# Patient Record
Sex: Female | Born: 1970 | Race: Black or African American | Hispanic: No | Marital: Single | Smoking: Current some day smoker
Health system: Southern US, Community
[De-identification: ages and names within clinical notes are randomized; demographics above are authoritative.]

## PROBLEM LIST (undated history)

## (undated) DIAGNOSIS — I219 Acute myocardial infarction, unspecified: Secondary | ICD-10-CM

## (undated) DIAGNOSIS — R87629 Unspecified abnormal cytological findings in specimens from vagina: Secondary | ICD-10-CM

## (undated) DIAGNOSIS — E785 Hyperlipidemia, unspecified: Secondary | ICD-10-CM

## (undated) DIAGNOSIS — I2510 Atherosclerotic heart disease of native coronary artery without angina pectoris: Secondary | ICD-10-CM

## (undated) DIAGNOSIS — I10 Essential (primary) hypertension: Secondary | ICD-10-CM

## (undated) HISTORY — DX: Hyperlipidemia, unspecified: E78.5

## (undated) HISTORY — PX: ECTOPIC PREGNANCY SURGERY: SHX613

## (undated) HISTORY — DX: Unspecified abnormal cytological findings in specimens from vagina: R87.629

## (undated) HISTORY — PX: CARDIAC CATHETERIZATION: SHX172

---

## 2010-12-13 ENCOUNTER — Emergency Department (HOSPITAL_BASED_OUTPATIENT_CLINIC_OR_DEPARTMENT_OTHER)
Admission: EM | Admit: 2010-12-13 | Discharge: 2010-12-14 | Disposition: A | Discharge status: Home / Self Care | Payer: PRIVATE HEALTH INSURANCE | Attending: Emergency Medicine | Admitting: Emergency Medicine

## 2010-12-13 ENCOUNTER — Encounter: Payer: Self-pay | Admitting: *Deleted

## 2010-12-13 DIAGNOSIS — R109 Unspecified abdominal pain: Secondary | ICD-10-CM | POA: Insufficient documentation

## 2010-12-13 DIAGNOSIS — I252 Old myocardial infarction: Secondary | ICD-10-CM | POA: Insufficient documentation

## 2010-12-13 DIAGNOSIS — N390 Urinary tract infection, site not specified: Secondary | ICD-10-CM | POA: Insufficient documentation

## 2010-12-13 DIAGNOSIS — A5901 Trichomonal vulvovaginitis: Secondary | ICD-10-CM | POA: Insufficient documentation

## 2010-12-13 DIAGNOSIS — A499 Bacterial infection, unspecified: Secondary | ICD-10-CM | POA: Insufficient documentation

## 2010-12-13 DIAGNOSIS — N760 Acute vaginitis: Secondary | ICD-10-CM | POA: Insufficient documentation

## 2010-12-13 DIAGNOSIS — I2510 Atherosclerotic heart disease of native coronary artery without angina pectoris: Secondary | ICD-10-CM | POA: Insufficient documentation

## 2010-12-13 DIAGNOSIS — F17200 Nicotine dependence, unspecified, uncomplicated: Secondary | ICD-10-CM | POA: Insufficient documentation

## 2010-12-13 DIAGNOSIS — B9689 Other specified bacterial agents as the cause of diseases classified elsewhere: Secondary | ICD-10-CM | POA: Insufficient documentation

## 2010-12-13 HISTORY — DX: Atherosclerotic heart disease of native coronary artery without angina pectoris: I25.10

## 2010-12-13 HISTORY — DX: Acute myocardial infarction, unspecified: I21.9

## 2010-12-13 LAB — URINALYSIS, ROUTINE W REFLEX MICROSCOPIC
Protein, ur: NEGATIVE mg/dL
Specific Gravity, Urine: 1.021 (ref 1.005–1.030)
Urobilinogen, UA: 0.2 mg/dL (ref 0.0–1.0)

## 2010-12-13 LAB — URINE MICROSCOPIC-ADD ON

## 2010-12-13 LAB — PREGNANCY, URINE: Preg Test, Ur: NEGATIVE

## 2010-12-13 NOTE — ED Provider Notes (Signed)
History    Scribed for Suzi Roots, MD, the patient was seen in room MH07/MH07. This chart was scribed by Clarita Crane. This patient's care was started at 11:53PM.  CSN: [id number] Arrival date & time: 12/13/2010 11:05 PM  Chief Complaint  Patient presents with  . Abdominal Pain   HPI Patient is a 40 year old female c/o constant moderate lower abdominal pain onset 2 days ago and persistent since with associated sensation of pressure with urination. Denies dysuria, fever, chills, nausea, vomiting, diarrhea, constipation, vaginal discharge, vaginal bleeding, back pain, flank pain, chest pain, cough, sore throat, SOB, dyspnea. States abdominal pain is not aggravated or relieved by anything. Reports LMP- 3 days ago. Denies h/o UTI, diverticulitis, pyelonephritis, kidney stone. Reports h/o CAD, MI and ectopic pregnancy. Patient is a current smoker, alcohol user and denies drug abuse.   PAST MEDICAL HISTORY:  Past Medical History  Diagnosis Date  . Coronary artery disease   . Myocardial infarction     PAST SURGICAL HISTORY:  Past Surgical History  Procedure Date  . Ectopic pregnancy surgery     MEDICATIONS:  Previous Medications   No medications on file     ALLERGIES:  Allergies as of 12/13/2010 - Review Complete 12/13/2010  Allergen Reaction Noted  . Ivp dye (iodinated diagnostic agents)  12/13/2010     FAMILY HISTORY:  No family history on file.   SOCIAL HISTORY: History   Social History  . Marital Status: Single    Spouse Name: N/A    Number of Children: N/A  . Years of Education: N/A   Social History Main Topics  . Smoking status: Current Everyday Smoker -- 0.5 packs/day  . Smokeless tobacco: None  . Alcohol Use: Yes  . Drug Use: No  . Sexually Active:    Other Topics Concern  . None   Social History Narrative  . None     Review of Systems 10 Systems reviewed and are negative for acute change except as noted in the HPI.  Physical Exam  BP 135/83   Pulse 83  Temp(Src) 98.4 F (36.9 C) (Oral)  Resp 20  SpO2 100%  Physical Exam  Nursing note and vitals reviewed. Constitutional: She is oriented to person, place, and time. Vital signs are normal. She appears well-developed and well-nourished. No distress.  HENT:  Head: Normocephalic and atraumatic.  Eyes: Conjunctivae are normal. Pupils are equal, round, and reactive to light.  Neck: Neck supple.  Cardiovascular: Normal rate and regular rhythm.  Exam reveals no gallop and no friction rub.   No murmur heard. Pulmonary/Chest: Effort normal and breath sounds normal. She has no wheezes. She has no rales.  Abdominal: Soft. Bowel sounds are normal. She exhibits no distension. There is no tenderness. There is no rebound, no guarding and no CVA tenderness.  Genitourinary:       Female chaperone present during exam. No CMT, No adnexal tenderness. Normal external genitalia. Mild discharge noted.   Musculoskeletal: Normal range of motion. She exhibits no edema.       Entire spine non-tender.   Neurological: She is alert and oriented to person, place, and time. No sensory deficit.  Skin: Skin is warm and dry.  Psychiatric: She has a normal mood and affect. Her behavior is normal.    ED Course  Procedures  MDM 12:28AM- Pelvic Exam performed  Results for orders placed during the hospital encounter of 12/13/10  URINALYSIS, ROUTINE W REFLEX MICROSCOPIC      Component Value  Range   Color, Urine YELLOW  YELLOW    Appearance CLEAR  CLEAR    Specific Gravity, Urine 1.021  1.005 - 1.030    pH 7.0  5.0 - 8.0    Glucose, UA NEGATIVE  NEGATIVE (mg/dL)   Hgb urine dipstick NEGATIVE  NEGATIVE    Bilirubin Urine NEGATIVE  NEGATIVE    Ketones, ur NEGATIVE  NEGATIVE (mg/dL)   Protein, ur NEGATIVE  NEGATIVE (mg/dL)   Urobilinogen, UA 0.2  0.0 - 1.0 (mg/dL)   Nitrite NEGATIVE  NEGATIVE    Leukocytes, UA MODERATE (*) NEGATIVE   PREGNANCY, URINE      Component Value Range   Preg Test, Ur NEGATIVE     URINE MICROSCOPIC-ADD ON      Component Value Range   Squamous Epithelial / LPF FEW (*) RARE    WBC, UA 7-10  <3 (WBC/hpf)   RBC / HPF 0-2  <3 (RBC/hpf)   Bacteria, UA FEW (*) RARE    Urine-Other TRICHOMONAS PRESENT    WET PREP, GENITAL      Component Value Range   Yeast, Wet Prep NONE SEEN  NONE SEEN    Trich, Wet Prep TOO NUMEROUS TO COUNT (*) NONE SEEN    Clue Cells, Wet Prep TOO NUMEROUS TO COUNT (*) NONE SEEN    WBC, Wet Prep HPF POC MANY (*) NONE SEEN     Will rx flagyl and cipro. No cerv motion tenderness, or focal adn mass or tenderness. abd soft nt. u cx sent. Pt afeb. No nv.   I personally performed the services described in this documentation, which was scribed in my presence. The recorded information has been reviewed and considered. Suzi Roots, MD    Suzi Roots, MD 12/14/10 0130

## 2010-12-13 NOTE — ED Notes (Signed)
Left lower quad pain x 2 days. States she feels like she has a knot in the area.

## 2010-12-14 LAB — WET PREP, GENITAL

## 2010-12-14 MED ORDER — METRONIDAZOLE 500 MG PO TABS: 500.0000 mg | ORAL_TABLET | Freq: Two times a day (BID) | ORAL | Status: AC

## 2010-12-14 MED ORDER — CIPROFLOXACIN HCL 500 MG PO TABS
500.0000 mg | ORAL_TABLET | Freq: Once | ORAL | Status: AC
  Administered 2010-12-14: 500 mg via ORAL
  Filled 2010-12-14: qty 1

## 2010-12-14 MED ORDER — METRONIDAZOLE 500 MG PO TABS
500.0000 mg | ORAL_TABLET | Freq: Once | ORAL | Status: AC
  Administered 2010-12-14: 500 mg via ORAL
  Filled 2010-12-14: qty 1

## 2010-12-14 MED ORDER — CIPROFLOXACIN HCL 500 MG PO TABS: 500.0000 mg | ORAL_TABLET | Freq: Two times a day (BID) | ORAL | Status: AC

## 2010-12-15 LAB — GC/CHLAMYDIA PROBE AMP, GENITAL
Chlamydia, DNA Probe: NEGATIVE
GC Probe Amp, Genital: NEGATIVE

## 2010-12-15 LAB — URINE CULTURE

## 2012-10-13 DIAGNOSIS — I2510 Atherosclerotic heart disease of native coronary artery without angina pectoris: Secondary | ICD-10-CM | POA: Insufficient documentation

## 2012-10-13 DIAGNOSIS — I10 Essential (primary) hypertension: Secondary | ICD-10-CM | POA: Insufficient documentation

## 2012-10-13 DIAGNOSIS — E785 Hyperlipidemia, unspecified: Secondary | ICD-10-CM | POA: Insufficient documentation

## 2012-10-13 DIAGNOSIS — F32A Depression, unspecified: Secondary | ICD-10-CM | POA: Insufficient documentation

## 2012-10-13 DIAGNOSIS — E559 Vitamin D deficiency, unspecified: Secondary | ICD-10-CM | POA: Insufficient documentation

## 2012-10-13 DIAGNOSIS — F17200 Nicotine dependence, unspecified, uncomplicated: Secondary | ICD-10-CM | POA: Insufficient documentation

## 2012-10-13 DIAGNOSIS — I25119 Atherosclerotic heart disease of native coronary artery with unspecified angina pectoris: Secondary | ICD-10-CM | POA: Insufficient documentation

## 2012-10-13 DIAGNOSIS — D509 Iron deficiency anemia, unspecified: Secondary | ICD-10-CM | POA: Insufficient documentation

## 2014-11-04 ENCOUNTER — Emergency Department (HOSPITAL_BASED_OUTPATIENT_CLINIC_OR_DEPARTMENT_OTHER)
Admission: EM | Admit: 2014-11-04 | Discharge: 2014-11-04 | Disposition: A | Discharge status: Home / Self Care | Payer: PRIVATE HEALTH INSURANCE | Attending: Emergency Medicine | Admitting: Emergency Medicine

## 2014-11-04 ENCOUNTER — Emergency Department (HOSPITAL_BASED_OUTPATIENT_CLINIC_OR_DEPARTMENT_OTHER): Payer: PRIVATE HEALTH INSURANCE

## 2014-11-04 ENCOUNTER — Encounter (HOSPITAL_BASED_OUTPATIENT_CLINIC_OR_DEPARTMENT_OTHER): Payer: Self-pay

## 2014-11-04 DIAGNOSIS — S92912A Unspecified fracture of left toe(s), initial encounter for closed fracture: Secondary | ICD-10-CM

## 2014-11-04 DIAGNOSIS — Y998 Other external cause status: Secondary | ICD-10-CM | POA: Insufficient documentation

## 2014-11-04 DIAGNOSIS — Z79899 Other long term (current) drug therapy: Secondary | ICD-10-CM | POA: Insufficient documentation

## 2014-11-04 DIAGNOSIS — W231XXA Caught, crushed, jammed, or pinched between stationary objects, initial encounter: Secondary | ICD-10-CM | POA: Insufficient documentation

## 2014-11-04 DIAGNOSIS — Y9301 Activity, walking, marching and hiking: Secondary | ICD-10-CM | POA: Insufficient documentation

## 2014-11-04 DIAGNOSIS — I2510 Atherosclerotic heart disease of native coronary artery without angina pectoris: Secondary | ICD-10-CM | POA: Insufficient documentation

## 2014-11-04 DIAGNOSIS — Z720 Tobacco use: Secondary | ICD-10-CM | POA: Insufficient documentation

## 2014-11-04 DIAGNOSIS — I10 Essential (primary) hypertension: Secondary | ICD-10-CM | POA: Insufficient documentation

## 2014-11-04 DIAGNOSIS — I252 Old myocardial infarction: Secondary | ICD-10-CM | POA: Insufficient documentation

## 2014-11-04 DIAGNOSIS — S92525A Nondisplaced fracture of medial phalanx of left lesser toe(s), initial encounter for closed fracture: Secondary | ICD-10-CM | POA: Insufficient documentation

## 2014-11-04 DIAGNOSIS — Y9289 Other specified places as the place of occurrence of the external cause: Secondary | ICD-10-CM | POA: Insufficient documentation

## 2014-11-04 HISTORY — DX: Essential (primary) hypertension: I10

## 2014-11-04 MED ORDER — OXYCODONE-ACETAMINOPHEN 5-325 MG PO TABS: 1.0000 | ORAL_TABLET | Freq: Four times a day (QID) | ORAL | Status: DC | PRN

## 2014-11-04 MED ORDER — OXYCODONE-ACETAMINOPHEN 5-325 MG PO TABS: 1.0000 | ORAL_TABLET | Freq: Once | ORAL | Status: DC

## 2014-11-04 NOTE — Discharge Instructions (Signed)
Return to the ED with any concerns including increased pain, swelling, discoloration or numbness of toes, or any other alarming symptoms

## 2014-11-04 NOTE — ED Notes (Signed)
Left fourth toe injury 3 days ago

## 2014-11-04 NOTE — ED Notes (Signed)
Buddy tape to left fourth toe and post op shoe applied.  CMS intake post intervention.

## 2014-11-04 NOTE — ED Provider Notes (Signed)
CSN: [id number]     Arrival date & time 11/04/14  1119 History   First MD Initiated Contact with Patient 11/04/14 1142     Chief Complaint  Patient presents with  . Toe Injury     (Consider location/radiation/quality/duration/timing/severity/associated sxs/prior Treatment) HPI  Pt presenting with pain in left 4th toe after injuring the toe 3 days ago.  She was walking and caught her toe on something.  The pain has not improved which prompted ED visit today.  Pain is constant and throbbing.  Pain is worse with movement and palpation.  No other areas of pain or injury.  There are no other associated systemic symptoms, there are no other alleviating or modifying factors.   Past Medical History  Diagnosis Date  . Coronary artery disease   . Myocardial infarction   . Hypertension    Past Surgical History  Procedure Laterality Date  . Ectopic pregnancy surgery     No family history on file. History  Substance Use Topics  . Smoking status: Current Every Day Smoker -- 0.50 packs/day  . Smokeless tobacco: Not on file  . Alcohol Use: Yes   OB History    No data available     Review of Systems  ROS reviewed and all otherwise negative except for mentioned in HPI    Allergies  Ivp dye  Home Medications   Prior to Admission medications   Medication Sig Start Date End Date Taking? Authorizing Provider  METOPROLOL TARTRATE PO Take by mouth.   Yes Historical Provider, MD  oxyCODONE-acetaminophen (PERCOCET/ROXICET) 5-325 MG per tablet Take 1-2 tablets by mouth every 6 (six) hours as needed for severe pain. 11/04/14   Jerelyn ScottMartha Linker, MD   BP 145/105 mmHg  Pulse 79  Temp(Src) 98 F (36.7 C) (Oral)  Resp 18  Ht 5\' 4"  (1.626 m)  Wt 202 lb (91.627 kg)  BMI 34.66 kg/m2  SpO2 100%  LMP 10/29/2014  Vitals reviewed Physical Exam  Physical Examination: General appearance - alert, well appearing, and in no distress Mental status - alert, oriented to person, place, and time Eyes - no  conjunctival injection, no scleral icterus Chest - clear to auscultation, no wheezes, rales or rhonchi, symmetric air entry Neurological - alert, oriented x 3, toe is distally NVI Musculoskeletal - ttp over left 4th toe with some soft tissue swelling, otherwise no joint tenderness, deformity or swelling Extremities - peripheral pulses normal, no pedal edema, no clubbing or cyanosis Skin - normal coloration and turgor, no rashes  ED Course  Procedures (including critical care time) Labs Review Labs Reviewed - No data to display  Imaging Review No results found.   EKG Interpretation None      MDM   Final diagnoses:  Toe fracture, left, closed, initial encounter    Xray shoes oblique fracture of 4th toe-  Xray images viewed and interpreted by me as well.  Toe buddy taped and patient given pain medication- given information for ortho followup.  Discharged with strict return precautions.  Pt agreeable with plan.  Nursing notes including past medical history and social history reviewed and considered in documentation      Jerelyn ScottMartha Linker, MD 11/07/14 1924

## 2015-05-22 DIAGNOSIS — I252 Old myocardial infarction: Secondary | ICD-10-CM | POA: Insufficient documentation

## 2017-02-01 IMAGING — CR DG TOE 4TH 2+V*L*
3 series · 3 of 3 positions shown · non-contrast
Comparison: None.

CLINICAL DATA: Stubbed left fourth toe 2 days ago on vacuum, pain

EXAM:
LEFT FOURTH TOE

[t toes ap left]
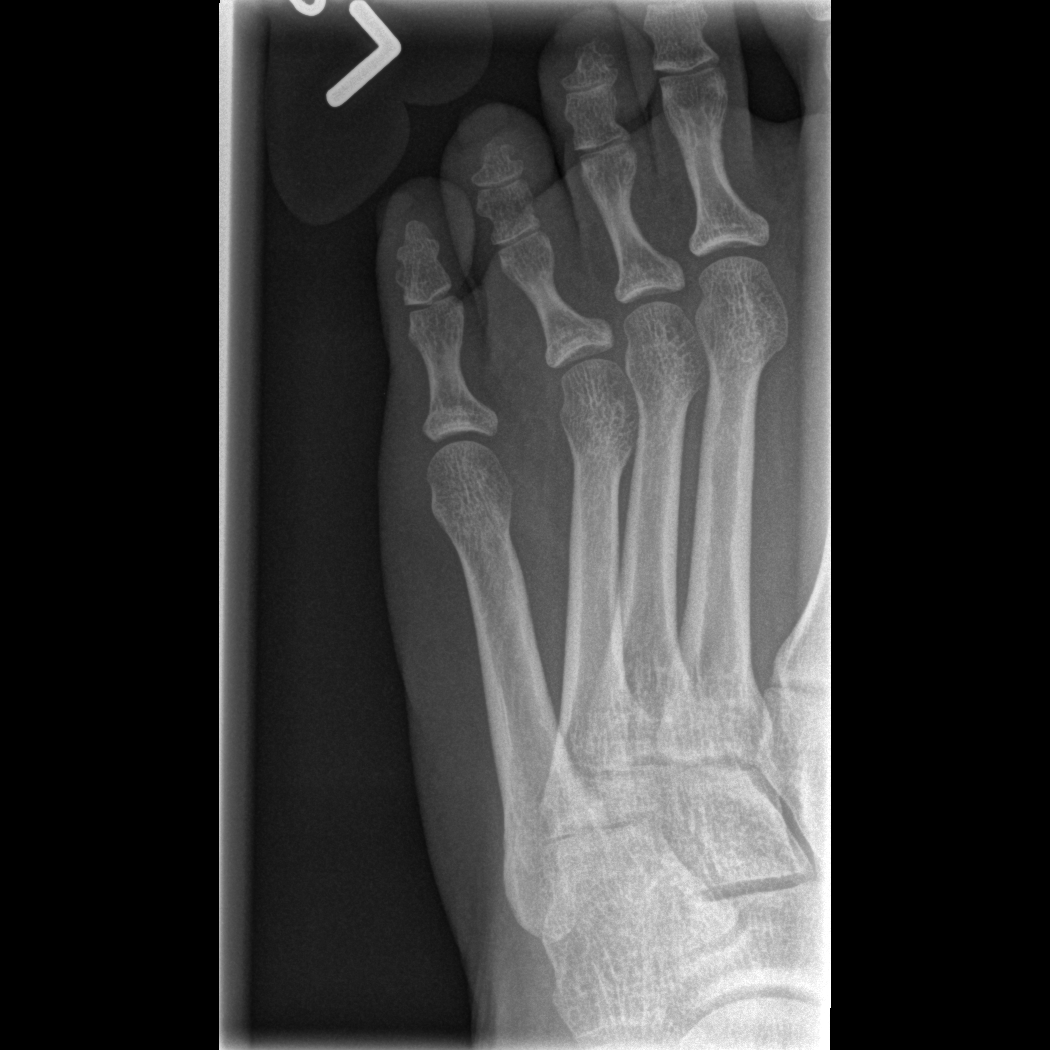

[t toes oblique left]
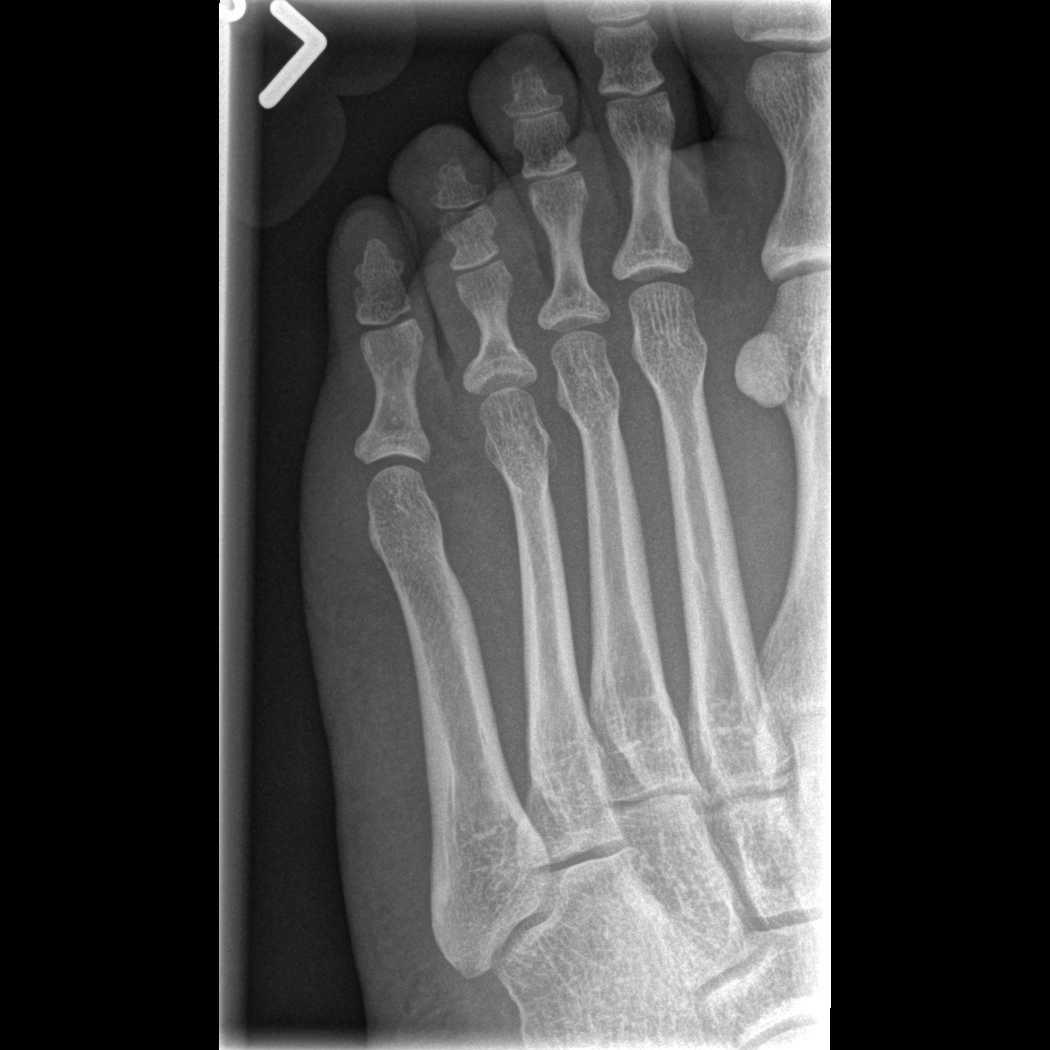

[t toes lateral left]
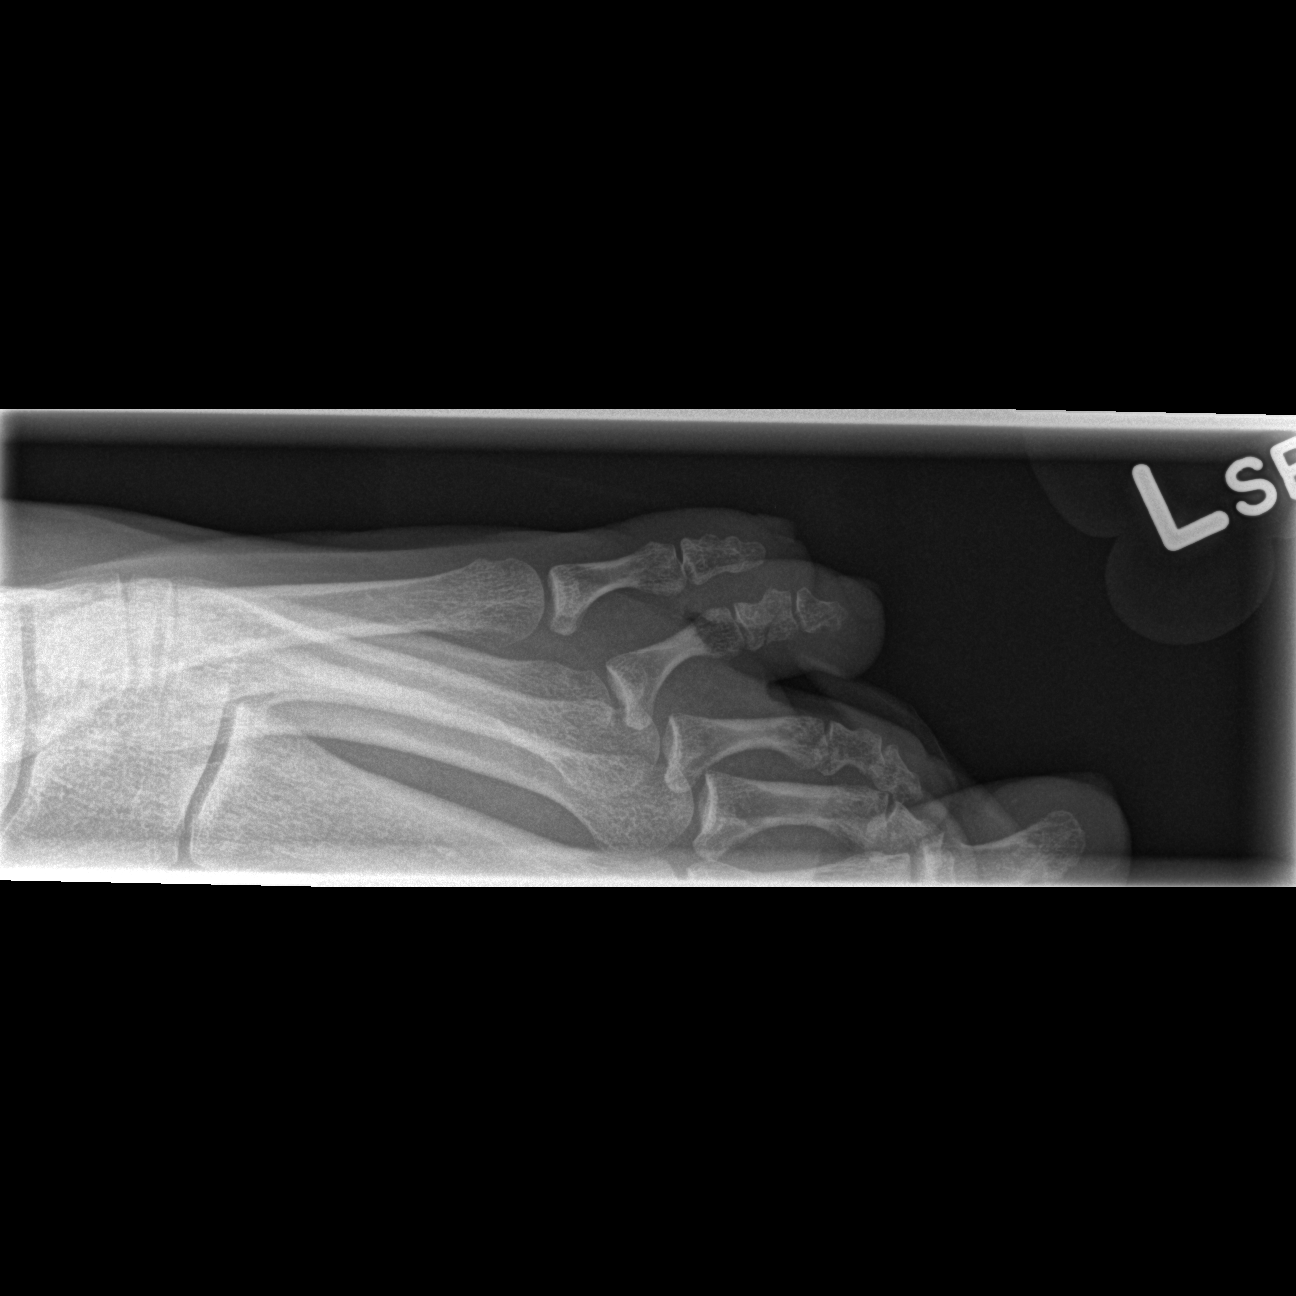

[3 of 3 positions shown; findings below may reference images not displayed]

FINDINGS: Three views of left fourth toe submitted. There is nondisplaced
oblique fracture middle phalanx seen on lateral view.
IMPRESSION: Nondisplaced oblique fracture middle phalanx left fourth toe.

## 2022-07-01 ENCOUNTER — Telehealth (HOSPITAL_BASED_OUTPATIENT_CLINIC_OR_DEPARTMENT_OTHER): Payer: Self-pay

## 2022-07-01 ENCOUNTER — Encounter: Payer: Self-pay | Admitting: Family

## 2022-07-01 ENCOUNTER — Ambulatory Visit (INDEPENDENT_AMBULATORY_CARE_PROVIDER_SITE_OTHER): Payer: 59 | Admitting: Family

## 2022-07-01 VITALS — BP 140/100 | HR 82 | Resp 18 | Ht 64.0 in | Wt 223.0 lb

## 2022-07-01 DIAGNOSIS — Z124 Encounter for screening for malignant neoplasm of cervix: Secondary | ICD-10-CM | POA: Diagnosis not present

## 2022-07-01 DIAGNOSIS — K625 Hemorrhage of anus and rectum: Secondary | ICD-10-CM | POA: Diagnosis not present

## 2022-07-01 DIAGNOSIS — R7309 Other abnormal glucose: Secondary | ICD-10-CM

## 2022-07-01 DIAGNOSIS — I10 Essential (primary) hypertension: Secondary | ICD-10-CM

## 2022-07-01 DIAGNOSIS — Z13220 Encounter for screening for lipoid disorders: Secondary | ICD-10-CM | POA: Diagnosis not present

## 2022-07-01 DIAGNOSIS — Z1231 Encounter for screening mammogram for malignant neoplasm of breast: Secondary | ICD-10-CM

## 2022-07-01 DIAGNOSIS — Z720 Tobacco use: Secondary | ICD-10-CM

## 2022-07-01 DIAGNOSIS — I252 Old myocardial infarction: Secondary | ICD-10-CM

## 2022-07-01 LAB — COMPREHENSIVE METABOLIC PANEL
ALT: 13 U/L (ref 0–35)
AST: 15 U/L (ref 0–37)
Albumin: 4.5 g/dL (ref 3.5–5.2)
Alkaline Phosphatase: 100 U/L (ref 39–117)
BUN: 12 mg/dL (ref 6–23)
CO2: 30 mEq/L (ref 19–32)
Calcium: 9.5 mg/dL (ref 8.4–10.5)
Chloride: 102 mEq/L (ref 96–112)
Creatinine, Ser: 0.84 mg/dL (ref 0.40–1.20)
GFR: 81.73 mL/min (ref >60.00)
Glucose, Bld: 88 mg/dL (ref 70–99)
Potassium: 3.7 mEq/L (ref 3.5–5.1)
Sodium: 140 mEq/L (ref 135–145)
Total Bilirubin: 0.4 mg/dL (ref 0.2–1.2)
Total Protein: 6.9 g/dL (ref 6.0–8.3)

## 2022-07-01 LAB — CBC WITH DIFFERENTIAL/PLATELET
Basophils Absolute: 0 10*3/uL (ref 0.0–0.1)
Basophils Relative: 0.4 % (ref 0.0–3.0)
Eosinophils Absolute: 0.3 10*3/uL (ref 0.0–0.7)
Eosinophils Relative: 3.1 % (ref 0.0–5.0)
HCT: 43.6 % (ref 36.0–46.0)
Hemoglobin: 14.4 g/dL (ref 12.0–15.0)
Lymphocytes Relative: 44.7 % (ref 12.0–46.0)
Lymphs Abs: 4.1 10*3/uL — ABNORMAL HIGH (ref 0.7–4.0)
MCHC: 33.1 g/dL (ref 30.0–36.0)
MCV: 82.8 fl (ref 78.0–100.0)
Monocytes Absolute: 0.7 10*3/uL (ref 0.1–1.0)
Monocytes Relative: 7.2 % (ref 3.0–12.0)
Neutro Abs: 4.1 10*3/uL (ref 1.4–7.7)
Neutrophils Relative %: 44.6 % (ref 43.0–77.0)
Platelets: 373 10*3/uL (ref 150.0–400.0)
RBC: 5.26 Mil/uL — ABNORMAL HIGH (ref 3.87–5.11)
RDW: 15.9 % — ABNORMAL HIGH (ref 11.5–15.5)
WBC: 9.1 10*3/uL (ref 4.0–10.5)

## 2022-07-01 LAB — LIPID PANEL
Cholesterol: 215 mg/dL — ABNORMAL HIGH (ref 0–200)
HDL: 45.8 mg/dL (ref >39.00)
LDL Cholesterol: 144 mg/dL — ABNORMAL HIGH (ref 0–99)
NonHDL: 169.19
Total CHOL/HDL Ratio: 5
Triglycerides: 128 mg/dL (ref 0.0–149.0)
VLDL: 25.6 mg/dL (ref 0.0–40.0)

## 2022-07-01 LAB — HEMOGLOBIN A1C: Hgb A1c MFr Bld: 6 % (ref 4.6–6.5)

## 2022-07-01 MED ORDER — AMLODIPINE BESYLATE 5 MG PO TABS: 5.0000 mg | ORAL_TABLET | Freq: Every day | ORAL | 1 refills | Status: DC

## 2022-07-01 MED ORDER — ROSUVASTATIN CALCIUM 10 MG PO TABS: 10.0000 mg | ORAL_TABLET | Freq: Every day | ORAL | 3 refills | Status: DC

## 2022-07-01 NOTE — Progress Notes (Signed)
Amy Wade is a 52 y.o. female with the following history as recorded in [employer]:  Patient Active Problem List   Diagnosis Date Noted   History of MI (myocardial infarction) 05/22/2015   Coronary atherosclerosis 10/13/2012   Essential hypertension 10/13/2012   Tobacco use disorder 10/13/2012    Current Outpatient Medications  Medication Sig Dispense Refill   rosuvastatin (CRESTOR) 10 MG tablet Take 1 tablet (10 mg total) by mouth daily. 90 tablet 3   amLODipine (NORVASC) 5 MG tablet Take 1 tablet (5 mg total) by mouth daily. 90 tablet 1   No current facility-administered medications for this visit.    Allergies: Ivp dye [iodinated contrast media]  Past Medical History:  Diagnosis Date   Coronary artery disease    Hypertension    Myocardial infarction [facility])     Past Surgical History:  Procedure Laterality Date   ECTOPIC PREGNANCY SURGERY      No family history on file.  Social History   Tobacco Use   Smoking status: Every Day    Packs/day: 0.50    Types: Cigarettes   Smokeless tobacco: Not on file  Substance Use Topics   Alcohol use: Yes    Subjective:   Presents today as a new patient; accompanied by her mother; notes she moved back to [place] almost a year ago and needs to get healthcare set up;  MI in 2011- needs to get back under care of cardiology; had one stent placed;  Has been "stretching" her blood pressure medication for the past year- has been on Amlodipine 5 mg intermittently for the past year; per patient, her last provider "stopped" her cholesterol medication; does not check her blood pressure regularly; has been having increased headaches;   Needs to get set up for cardiology, GYN;   Has been having some rectal bleeding intermittently for the past month- describes blood as "dark"- no abdominal pain; no weight loss; no appetite changes; has not had baseline colonoscopy;   LMP- uterine ablation 2021- LMP over a year ago;      Objective:  Vitals:    07/01/22 0836 07/01/22 0913  BP: (!) 148/92 (!) 140/100  Pulse: 82   Resp: 18   SpO2: 100%   Weight: 223 lb (101.2 kg)   Height: '5\' 4"'$  (1.626 m)     General: Well developed, well nourished, in no acute distress  Skin : Warm and dry.  Head: Normocephalic and atraumatic  Lungs: Respirations unlabored; clear to auscultation bilaterally without wheeze, rales, rhonchi  CVS exam: normal rate and regular rhythm.  Musculoskeletal: No deformities; no active joint inflammation  Extremities: No edema, cyanosis, clubbing  Vessels: Symmetric bilaterally  Neurologic: Alert and oriented; speech intact; face symmetrical; moves all extremities well; CNII-XII intact without focal deficit   Assessment:  1. Rectal bleeding   2. Lipid screening   3. Elevated glucose   4. Cervical cancer screening   5. History of MI (myocardial infarction)   6. Essential hypertension   7. Visit for screening mammogram   8. Tobacco abuse     Plan:  Check CBC, CMP today; refer to GI for further evaluation- will need baseline colonoscopy; Check lipid panel- explained to patient that with history of MI/ CAD, she does need to be on statin- will re-start Crestor 10 mg daily; may need to increase or adjust dosage based on labs; Check Hgba1c; Refer to GYN to establish care Stressed need to quit smoking and take her medications as prescribed; plan to follow up  in 2 months- will most likely need to adjust blood pressure medication if changes are not made sooner by cardiology;  Uncontrolled- has not been taking medication regularly; refill Amlodipine 5 mg; follow up in 2 months;  Order updated;   No follow-ups on file.  Orders Placed This Encounter  Procedures   MM Digital Screening    Standing Status:   Future    Standing Expiration Date:   07/02/2023    Order Specific Question:   Reason for Exam (SYMPTOM  OR DIAGNOSIS REQUIRED)    Answer:   screening mammogram    Order Specific Question:   Is the patient pregnant?     Answer:   No    Order Specific Question:   Preferred imaging location?    Answer:   [facility]   CBC with Differential/Platelet   Comp Met (CMET)   Lipid panel   Hemoglobin A1c   Iron, TIBC and Ferritin Panel   Ambulatory referral to Obstetrics / Gynecology    Referral Priority:   Routine    Referral Type:   Consultation    Referral Reason:   Specialty Services Required    Requested Specialty:   Obstetrics and Gynecology    Number of Visits Requested:   1   Ambulatory referral to Gastroenterology    Referral Priority:   Routine    Referral Type:   Consultation    Referral Reason:   Specialty Services Required    Number of Visits Requested:   1   Ambulatory referral to Cardiology    Referral Priority:   Routine    Referral Type:   Consultation    Referral Reason:   Specialty Services Required    Requested Specialty:   Cardiology    Number of Visits Requested:   1    Requested Prescriptions   Signed Prescriptions Disp Refills   amLODipine (NORVASC) 5 MG tablet 90 tablet 1    Sig: Take 1 tablet (5 mg total) by mouth daily.   rosuvastatin (CRESTOR) 10 MG tablet 90 tablet 3    Sig: Take 1 tablet (10 mg total) by mouth daily.

## 2022-07-02 LAB — IRON,TIBC AND FERRITIN PANEL
%SAT: 12 % (calc) — ABNORMAL LOW (ref 16–45)
Ferritin: 12 ng/mL — ABNORMAL LOW (ref 16–232)
Iron: 48 ug/dL (ref 40–190)
TIBC: 399 mcg/dL (calc) (ref 250–450)

## 2022-07-08 ENCOUNTER — Encounter: Payer: Self-pay | Admitting: General Practice

## 2022-07-24 NOTE — Progress Notes (Signed)
Cardiology Office Note:   Date:  07/26/2022  NAME:  Amy Wade    MRN: [id number] DOB:  10-11-1970   PCP:  Amy Bass, FNP  Cardiologist:  None  Electrophysiologist:  None   Referring MD: Amy Wade,*   Chief Complaint  Patient presents with   Chest Pain   History of Present Illness:   Amy Wade is a 52 y.o. female with a hx of HTN, HLD, CAD who is being seen today for the evaluation of CAD at the request of Amy Scrape Allyne Gee, FNP.  She reports for the past 2 weeks she has had episodes of chest pressure.  Described as left-sided.  Occurs while sitting.  She reports position improved this.  She also reports stretching her back.  She tells me activity does not exacerbate it.  Rest does not improve it.  She reports she has it every day.  Last several minutes.  No association with food.  No fevers or chills.  BP today 182/104.  Not controlled.  She did not take her medications.  She has a history of PCI in 2012 at [facility].  I do not know the details.  Apparently she had a heart cath in 2016 per review of records that was normal.  EKG demonstrates sinus rhythm heart rate 72 with repolarization abnormality.  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  Her CV examination is normal.  She is a current every day smoker.  She has smoked for nearly 20 years.  She started back 2 years ago.  We discussed stopping this.  She has not started her Crestor.  She is not diabetic.  She is not married.  She has children and grandchildren.  No regular exercise.  She reports her diet is poor.  She does have a heart history in the family and her father.  Symptoms are continuing daily.  Suspect this is musculoskeletal.  We discussed stress test for further evaluation.  She is okay to do this.  Problem List CAD -PCI 2012 ([place]) 2. HLD -T chol 215, HDL 45, LDL 144, TG 128 3. HTN  Past Medical History: Past Medical History:  Diagnosis Date    Coronary artery disease    Hyperlipidemia    Hypertension    Myocardial infarction [facility])     Past Surgical History: Past Surgical History:  Procedure Laterality Date   CARDIAC CATHETERIZATION     ECTOPIC PREGNANCY SURGERY      Current Medications: Current Meds  Medication Sig   amLODipine (NORVASC) 5 MG tablet Take 1 tablet (5 mg total) by mouth daily.   aspirin EC 81 MG tablet Take 1 tablet (81 mg total) by mouth daily. Swallow whole.   losartan (COZAAR) 50 MG tablet Take 1 tablet (50 mg total) by mouth daily.   nitroGLYCERIN (NITROSTAT) 0.4 MG SL tablet Place 1 tablet (0.4 mg total) under the tongue every 5 (five) minutes as needed for chest pain.   [DISCONTINUED] rosuvastatin (CRESTOR) 10 MG tablet Take 1 tablet (10 mg total) by mouth daily.     Allergies:    Ivp dye [iodinated contrast media]   Social History: Social History   Socioeconomic History   Marital status: Single    Spouse name: Not on file   Number of children: 2   Years of education: Not on file   Highest education level: Not on file  Occupational History   Not on file  Tobacco Use   Smoking  status: Every Day    Packs/day: 0.50    Years: 20.00    Additional pack years: 0.00    Total pack years: 10.00    Types: Cigarettes   Smokeless tobacco: Not on file  Substance and Sexual Activity   Alcohol use: Yes   Drug use: No   Sexual activity: Yes    Birth control/protection: None  Other Topics Concern   Not on file  Social History Narrative   Not on file   Social Determinants of Health   Financial Resource Strain: Not on file  Food Insecurity: Not on file  Transportation Needs: Not on file  Physical Activity: Not on file  Stress: Not on file  Social Connections: Not on file     Family History: The patient's family history includes Heart disease in her father.  ROS:   All other ROS reviewed and negative. Pertinent positives noted in the HPI.     EKGs/Labs/Other Studies Reviewed:   The  following studies were personally reviewed by me today:  EKG:  EKG is ordered today.  The ekg ordered today demonstrates normal sinus rhythm heart rate 72, inferior Q waves, inferolateral T wave inversions likely repolarization abnormality, and was personally reviewed by me.   Recent Labs: 07/01/2022: ALT 13; BUN 12; Creatinine, Ser 0.84; Hemoglobin 14.4; Platelets 373.0; Potassium 3.7; Sodium 140   Recent Lipid Panel    Component Value Date/Time   CHOL 215 (H) 07/01/2022 0909   TRIG 128.0 07/01/2022 0909   HDL 45.80 07/01/2022 0909   CHOLHDL 5 07/01/2022 0909   VLDL 25.6 07/01/2022 0909   LDLCALC 144 (H) 07/01/2022 0909    Physical Exam:   VS:  BP (!) 182/104   Pulse 72   Ht 5\' 4"  (1.626 m)   Wt 222 lb (100.7 kg)   SpO2 99%   BMI 38.11 kg/m    Wt Readings from Last 3 Encounters:  07/26/22 222 lb (100.7 kg)  07/01/22 223 lb (101.2 kg)  11/04/14 202 lb (91.6 kg)    General: Well nourished, well developed, in no acute distress Head: Atraumatic, normal size  Eyes: PEERLA, EOMI  Neck: Supple, no JVD Endocrine: No thryomegaly Cardiac: Normal S1, S2; RRR; no murmurs, rubs, or gallops Lungs: Clear to auscultation bilaterally, no wheezing, rhonchi or rales  Abd: Soft, nontender, no hepatomegaly  Ext: No edema, pulses 2+ Musculoskeletal: No deformities, BUE and BLE strength normal and equal Skin: Warm and dry, no rashes   Neuro: Alert and oriented to person, place, time, and situation, CNII-XII grossly intact, no focal deficits  Psych: Normal mood and affect   ASSESSMENT:   Amy Wade is a 52 y.o. female who presents for the following: 1. Precordial pain   2. Coronary artery disease involving native coronary artery of native heart without angina pectoris   3. Mixed hyperlipidemia   4. Renovascular hypertension   5. Tobacco abuse     PLAN:   1. Precordial pain 2. Coronary artery disease involving native coronary artery of native heart without angina  pectoris -History of PCI in 2012 in [facility].  Details are unclear.  She reports she is having 2 weeks of pressure in her chest.  Symptoms occur at rest.  Not exertional.  Alleviated by rest.  No association with food.  CV exam normal.  EKG shows inferior infarct which was on her EKG report from 2017 through care everywhere.  She does have inferolateral T wave inversions which appear to be new.  This could be related to hypertension.  She is without chest discomfort currently.  We discussed a PET stress test as well as echocardiogram for further evaluation.  She should start aspirin.  She will be given a prescription for sublingual nitroglycerin.  Her symptoms could be blood pressure related.  I would like to add losartan 50.  She is on amlodipine 5 as well.  She will also start on Crestor.  We also discussed smoking cessation.  She will see me back in 3 months to discuss further.  She was given strict return precautions. -Of note her EKG is abnormal.  She cannot undergo an exercise treadmill stress test due to inability to exercise.  3. Mixed hyperlipidemia -Start Crestor 20 mg daily.  Goal LDL less than 55.  4. Renovascular hypertension -Continue amlodipine 5 mg daily.  Above symptoms could be blood pressure related.  Add losartan 50 mg daily.  Obtain echo.  Stress test as above.  Smoking cessation discussed.  5. Tobacco abuse -3 minutes of smoking cessation counseling was provided in office today.  She is an everyday smoker for the past 20 years.  Needs to quit.  We discussed the relationship of smoking and heart disease.   Shared Decision Making/Informed Consent The risks [chest pain, shortness of breath, cardiac arrhythmias, dizziness, blood pressure fluctuations, myocardial infarction, stroke/transient ischemic attack, nausea, vomiting, allergic reaction, radiation exposure, metallic taste sensation and life-threatening complications (estimated to be 1 in 10,000)], benefits  (risk stratification, diagnosing coronary artery disease, treatment guidance) and alternatives of a cardiac PET stress test were discussed in detail with Ms. Gorsuch and she agrees to proceed.  Disposition: Return in about 3 months (around 10/26/2022).  Medication Adjustments/Labs and Tests Ordered: Current medicines are reviewed at length with the patient today.  Concerns regarding medicines are outlined above.  Orders Placed This Encounter  Procedures   NM PET CT CARDIAC PERFUSION MULTI W/ABSOLUTE BLOODFLOW   Lipid panel   EKG 12-Lead   ECHOCARDIOGRAM COMPLETE   Meds ordered this encounter  Medications   losartan (COZAAR) 50 MG tablet    Sig: Take 1 tablet (50 mg total) by mouth daily.    Dispense:  90 tablet    Refill:  3   rosuvastatin (CRESTOR) 20 MG tablet    Sig: Take 1 tablet (20 mg total) by mouth daily.    Dispense:  90 tablet    Refill:  3   aspirin EC 81 MG tablet    Sig: Take 1 tablet (81 mg total) by mouth daily. Swallow whole.    Dispense:  90 tablet    Refill:  3   nitroGLYCERIN (NITROSTAT) 0.4 MG SL tablet    Sig: Place 1 tablet (0.4 mg total) under the tongue every 5 (five) minutes as needed for chest pain.    Dispense:  25 tablet    Refill:  3    Patient Instructions  Medication Instructions:  START Losartan 50 mg daily  INCREASE Crestor 20 mg daily  START Aspirin 81 mg daily  TAKE Nitroglycerin as directed.  NITROGLYCERIN  is a type of vasodilator. It relaxes blood vessels, increasing the blood and oxygen supply to your heart. This medicine is used to relieve chest pain caused by angina.  In an angina attack (CHEST PAIN), you should feel better within 5 minutes after your first dose. Do not swallow whole. Place tablet under your tongue. Sit down when taking this medicine. You can take a dose every 5 minutes up to a  total of 3 doses. If you do not feel better or feel worse after 1 dose, call 9-1-1 at once. Do not take more than 3 doses in 15 minutes. Do  not take your medicine more often than directed.   *If you need a refill on your cardiac medications before your next appointment, please call your pharmacy*   Lab Work: LIPID (come back fasting, 1 week before 3 month visit)   If you have labs (blood work) drawn today and your tests are completely normal, you will receive your results only by: MyChart Message (if you have MyChart) OR A paper copy in the mail If you have any lab test that is abnormal or we need to change your treatment, we will call you to review the results.   Testing/Procedures: Echocardiogram - Your physician has requested that you have an echocardiogram. Echocardiography is a painless test that uses sound waves to create images of your heart. It provides your doctor with information about the size and shape of your heart and how well your heart's chambers and valves are working. This procedure takes approximately one hour. There are no restrictions for this procedure.   CARDIAC PET- Your physician has requested that you have a Cardiac Pet Stress Test. This testing is completed at [facility] ([address] [place], [place] [address]). The schedulers will call you to get this scheduled. Please follow instructions below and call the office with any questions/concerns [phone]).   Follow-Up: At [facility], you and your health needs are our priority.  As part of our continuing mission to provide you with exceptional heart care, we have created designated Provider Care Teams.  These Care Teams include your primary Cardiologist (physician) and Advanced Practice Providers (APPs -  Physician Assistants and Nurse Practitioners) who all work together to provide you with the care you need, when you need it.  We recommend signing up for the patient portal called "MyChart".  Sign up information is provided on this After Visit Summary.  MyChart is used to connect with patients for Virtual Visits  (Telemedicine).  Patients are able to view lab/test results, encounter notes, upcoming appointments, etc.  Non-urgent messages can be sent to your provider as well.   To learn more about what you can do with MyChart, go to [email or web address].    Your next appointment:   3 month(s)  Provider:   Azalee Course, PA-C or Marjie Skiff, PA-C    Other Instructions  How to Prepare for Your Cardiac PET/CT Stress Test:  1. Please do not take these medications before your test:   Medications that may interfere with the cardiac pharmacological stress agent (ex. nitrates - including erectile dysfunction medications, isosorbide mononitrate, tamulosin or beta-blockers) the day of the exam. (Erectile dysfunction medication should be held for at least 72 hrs prior to test) Theophylline containing medications for 12 hours. Dipyridamole 48 hours prior to the test. Your remaining medications may be taken with water.  2. Nothing to eat or drink, except water, 3 hours prior to arrival time.   NO caffeine/decaffeinated products, or chocolate 12 hours prior to arrival.  3. NO perfume, cologne or lotion  4. Total time is 1 to 2 hours; you may want to bring reading material for the waiting time.  5. Please report to Radiology at the [facility] Main Entrance 30 minutes early for your test.  [address] [place], [place] [address]  Diabetic Preparation:  Hold oral medications. You  may take NPH and Lantus insulin. Do not take Humalog or Humulin R (Regular Insulin) the day of your test. Check blood sugars prior to leaving the house. If able to eat breakfast prior to 3 hour fasting, you may take all medications, including your insulin, Do not worry if you miss your breakfast dose of insulin - start at your next meal.  IF YOU THINK YOU MAY BE PREGNANT, OR ARE NURSING PLEASE INFORM THE TECHNOLOGIST.  In preparation for your appointment, medication and supplies will be purchased.  Appointment  availability is limited, so if you need to cancel or reschedule, please call the Radiology Department at [phone]  24 hours in advance to avoid a cancellation fee of $100.00  What to Expect After you Arrive:  Once you arrive and check in for your appointment, you will be taken to a preparation room within the Radiology Department.  A technologist or Nurse will obtain your medical history, verify that you are correctly prepped for the exam, and explain the procedure.  Afterwards,  an IV will be started in your arm and electrodes will be placed on your skin for EKG monitoring during the stress portion of the exam. Then you will be escorted to the PET/CT scanner.  There, staff will get you positioned on the scanner and obtain a blood pressure and EKG.  During the exam, you will continue to be connected to the EKG and blood pressure machines.  A small, safe amount of a radioactive tracer will be injected in your IV to obtain a series of pictures of your heart along with an injection of a stress agent.    After your Exam:  It is recommended that you eat a meal and drink a caffeinated beverage to counter act any effects of the stress agent.  Drink plenty of fluids for the remainder of the day and urinate frequently for the first couple of hours after the exam.  Your doctor will inform you of your test results within 7-10 business days.  For questions about your test or how to prepare for your test, please call: Rockwell Alexandria, Cardiac Imaging Nurse Navigator  Larey Brick, Cardiac Imaging Nurse Navigator Office: [phone]     Signed, Lenna Gilford. Flora Lipps, MD, [facility]  [facility]  [address], [address] [place], [place] [address] [phone]  07/26/2022 9:35 AM

## 2022-07-26 ENCOUNTER — Encounter: Payer: Self-pay | Admitting: Cardiovascular Disease

## 2022-07-26 ENCOUNTER — Ambulatory Visit: Payer: 59 | Attending: Cardiovascular Disease | Admitting: Cardiovascular Disease

## 2022-07-26 VITALS — BP 182/104 | HR 72 | Ht 64.0 in | Wt 222.0 lb

## 2022-07-26 DIAGNOSIS — I150 Renovascular hypertension: Secondary | ICD-10-CM

## 2022-07-26 DIAGNOSIS — E782 Mixed hyperlipidemia: Secondary | ICD-10-CM | POA: Diagnosis not present

## 2022-07-26 DIAGNOSIS — R072 Precordial pain: Secondary | ICD-10-CM

## 2022-07-26 DIAGNOSIS — Z720 Tobacco use: Secondary | ICD-10-CM

## 2022-07-26 DIAGNOSIS — F17210 Nicotine dependence, cigarettes, uncomplicated: Secondary | ICD-10-CM

## 2022-07-26 DIAGNOSIS — I2510 Atherosclerotic heart disease of native coronary artery without angina pectoris: Secondary | ICD-10-CM | POA: Diagnosis not present

## 2022-07-26 MED ORDER — ASPIRIN 81 MG PO TBEC: 81.0000 mg | DELAYED_RELEASE_TABLET | Freq: Every day | ORAL | 3 refills | Status: DC

## 2022-07-26 MED ORDER — ROSUVASTATIN CALCIUM 20 MG PO TABS: 20.0000 mg | ORAL_TABLET | Freq: Every day | ORAL | 3 refills | Status: DC

## 2022-07-26 MED ORDER — NITROGLYCERIN 0.4 MG SL SUBL: 0.4000 mg | SUBLINGUAL_TABLET | SUBLINGUAL | 3 refills | Status: DC | PRN

## 2022-07-26 MED ORDER — LOSARTAN POTASSIUM 50 MG PO TABS: 50.0000 mg | ORAL_TABLET | Freq: Every day | ORAL | 3 refills | Status: DC

## 2022-07-26 NOTE — Patient Instructions (Signed)
Medication Instructions:  START Losartan 50 mg daily  INCREASE Crestor 20 mg daily  START Aspirin 81 mg daily  TAKE Nitroglycerin as directed.  NITROGLYCERIN  is a type of vasodilator. It relaxes blood vessels, increasing the blood and oxygen supply to your heart. This medicine is used to relieve chest pain caused by angina.  In an angina attack (CHEST PAIN), you should feel better within 5 minutes after your first dose. Do not swallow whole. Place tablet under your tongue. Sit down when taking this medicine. You can take a dose every 5 minutes up to a total of 3 doses. If you do not feel better or feel worse after 1 dose, call 9-1-1 at once. Do not take more than 3 doses in 15 minutes. Do not take your medicine more often than directed.   *If you need a refill on your cardiac medications before your next appointment, please call your pharmacy*   Lab Work: LIPID (come back fasting, 1 week before 3 month visit)   If you have labs (blood work) drawn today and your tests are completely normal, you will receive your results only by: [place] (if you have MyChart) OR A paper copy in the mail If you have any lab test that is abnormal or we need to change your treatment, we will call you to review the results.   Testing/Procedures: Echocardiogram - Your physician has requested that you have an echocardiogram. Echocardiography is a painless test that uses sound waves to create images of your heart. It provides your doctor with information about the size and shape of your heart and how well your heart's chambers and valves are working. This procedure takes approximately one hour. There are no restrictions for this procedure.   CARDIAC PET- Your physician has requested that you have a Cardiac Pet Stress Test. This testing is completed at [facility] ([place], [place] [address]). The schedulers will call you to get this scheduled. Please follow instructions  below and call the office with any questions/concerns [phone]).   Follow-Up: At [facility], you and your health needs are our priority.  As part of our continuing mission to provide you with exceptional heart care, we have created designated Provider Care Teams.  These Care Teams include your primary Cardiologist (physician) and Advanced Practice Providers (APPs -  Physician Assistants and Nurse Practitioners) who all work together to provide you with the care you need, when you need it.  We recommend signing up for the patient portal called "MyChart".  Sign up information is provided on this After Visit Summary.  MyChart is used to connect with patients for Virtual Visits (Telemedicine).  Patients are able to view lab/test results, encounter notes, upcoming appointments, etc.  Non-urgent messages can be sent to your provider as well.   To learn more about what you can do with MyChart, go to [email or web address].    Your next appointment:   3 month(s)  Provider:   Almyra Deforest, PA-C or Sande Rives, PA-C    Other Instructions  How to Prepare for Your Cardiac PET/CT Stress Test:  1. Please do not take these medications before your test:   Medications that may interfere with the cardiac pharmacological stress agent (ex. nitrates - including erectile dysfunction medications, isosorbide mononitrate, tamulosin or beta-blockers) the day of the exam. (Erectile dysfunction medication should be held for at least 72 hrs prior to test) Theophylline containing medications for 12 hours. Dipyridamole 48 hours prior to  the test. Your remaining medications may be taken with water.  2. Nothing to eat or drink, except water, 3 hours prior to arrival time.   NO caffeine/decaffeinated products, or chocolate 12 hours prior to arrival.  3. NO perfume, cologne or lotion  4. Total time is 1 to 2 hours; you may want to bring reading material for the waiting time.  5. Please report to  Radiology at the [facility] Main Entrance 30 minutes early for your test.  [place], [place] [address]  Diabetic Preparation:  Hold oral medications. You may take NPH and Lantus insulin. Do not take Humalog or Humulin R (Regular Insulin) the day of your test. Check blood sugars prior to leaving the house. If able to eat breakfast prior to 3 hour fasting, you may take all medications, including your insulin, Do not worry if you miss your breakfast dose of insulin - start at your next meal.  IF YOU THINK YOU MAY BE PREGNANT, OR ARE NURSING PLEASE INFORM THE TECHNOLOGIST.  In preparation for your appointment, medication and supplies will be purchased.  Appointment availability is limited, so if you need to cancel or reschedule, please call the Radiology Department at [phone]  24 hours in advance to avoid a cancellation fee of $100.00  What to Expect After you Arrive:  Once you arrive and check in for your appointment, you will be taken to a preparation room within the Radiology Department.  A technologist or Nurse will obtain your medical history, verify that you are correctly prepped for the exam, and explain the procedure.  Afterwards,  an IV will be started in your arm and electrodes will be placed on your skin for EKG monitoring during the stress portion of the exam. Then you will be escorted to the PET/CT scanner.  There, staff will get you positioned on the scanner and obtain a blood pressure and EKG.  During the exam, you will continue to be connected to the EKG and blood pressure machines.  A small, safe amount of a radioactive tracer will be injected in your IV to obtain a series of pictures of your heart along with an injection of a stress agent.    After your Exam:  It is recommended that you eat a meal and drink a caffeinated beverage to counter act any effects of the stress agent.  Drink plenty of fluids for the remainder of the day and urinate  frequently for the first couple of hours after the exam.  Your doctor will inform you of your test results within 7-10 business days.  For questions about your test or how to prepare for your test, please call: Marchia Bond, Cardiac Imaging Nurse Navigator  Gordy Clement, Cardiac Imaging Nurse Navigator Office: [phone]

## 2022-08-04 ENCOUNTER — Encounter: Payer: Self-pay | Admitting: Gastroenterology

## 2022-08-04 ENCOUNTER — Other Ambulatory Visit (HOSPITAL_COMMUNITY)
Admission: RE | Admit: 2022-08-04 | Discharge: 2022-08-04 | Disposition: A | Discharge status: Home / Self Care | Payer: 59 | Source: Ambulatory Visit | Attending: Obstetrics & Gynecology | Admitting: Obstetrics & Gynecology

## 2022-08-04 ENCOUNTER — Encounter: Payer: Self-pay | Admitting: Family

## 2022-08-04 ENCOUNTER — Ambulatory Visit (INDEPENDENT_AMBULATORY_CARE_PROVIDER_SITE_OTHER): Payer: 59 | Admitting: Family Medicine

## 2022-08-04 ENCOUNTER — Encounter: Payer: Self-pay | Admitting: Family Medicine

## 2022-08-04 VITALS — BP 152/87 | HR 90 | Ht 64.0 in | Wt 223.0 lb

## 2022-08-04 DIAGNOSIS — Z809 Family history of malignant neoplasm, unspecified: Secondary | ICD-10-CM

## 2022-08-04 DIAGNOSIS — Z01419 Encounter for gynecological examination (general) (routine) without abnormal findings: Secondary | ICD-10-CM

## 2022-08-04 DIAGNOSIS — I10 Essential (primary) hypertension: Secondary | ICD-10-CM | POA: Diagnosis not present

## 2022-08-04 DIAGNOSIS — R232 Flushing: Secondary | ICD-10-CM

## 2022-08-04 DIAGNOSIS — Z124 Encounter for screening for malignant neoplasm of cervix: Secondary | ICD-10-CM

## 2022-08-04 DIAGNOSIS — F17200 Nicotine dependence, unspecified, uncomplicated: Secondary | ICD-10-CM

## 2022-08-04 DIAGNOSIS — N950 Postmenopausal bleeding: Secondary | ICD-10-CM | POA: Diagnosis not present

## 2022-08-04 DIAGNOSIS — Z1231 Encounter for screening mammogram for malignant neoplasm of breast: Secondary | ICD-10-CM

## 2022-08-04 NOTE — Progress Notes (Signed)
Patient seen in conjunction with the medical student.  I have taken the history and preformed the exam.  The above note has been edited as necessary.  Procedure: Patient given informed consent, signed copy in the chart, time out was performed. Appropriate time out taken. . The patient was placed in the lithotomy position and the cervix brought into view with sterile speculum.  Portio of cervix cleansed x 2 with betadine swabs.  A tenaculum was placed in the anterior lip of the cervix.  The uterus was sounded for depth of 8 cm. A pipelle was introduced to into the uterus, suction created,  and an endometrial sample was obtained. All equipment was removed and accounted for.  The patient tolerated the procedure well.    Amy Jude, MD  08/04/2022 10:40 AM

## 2022-08-04 NOTE — Progress Notes (Signed)
ANNUAL EXAM Patient name: Amy Wade MRN [id number]  Date of birth: January 11, 1971 Chief Complaint:   No chief complaint on file.  History of Present Illness:   Amy Wade is a 52 y.o. [phone] African-American female being seen today for a routine annual exam.  Current complaints: vaginal bleeding  Reports no menses for past 2 years, and was having symptoms of menopause such as hot flashes, weight gain, and fatigue. Then a little over a week ago had seven days of intense vaginal bleeding associated with strong cramping, described as a very bad period. She reports she was bleeding heavily enough to soak pads.  Hx of BTL ~15 yr ago, uterine ablation ~10 yr ago.  Reports frequent episodes of CP; has seen cardiology for this who scheduled a stress test (not yet done) and prescribed NTG; she is taking up to 3-4 NTG a day for her chest pain. Hx of CAD s/p stents in her mid-30s; Fhx of MI. Has HTN, HLD. Current 1/3-1/4 ppd smoker; wanting to quit but having difficulty.  Fhx breast cancer in her maternal grandmother (at 20yo), lethal breast cancer in a maternal aunt (at 16yo), lethal uterine cancer in a maternal great aunt, ovarian cancer in maternal cousin.  Patient's last menstrual period was 07/26/2022.  Last pap done years ago. Last mammogram: done years ago. Last colonoscopy: N/A. Family practitioner scheduled colonoscopy, not yet done.  Of note, she moved back to [place] from [place] in 08/2021; she had not started to reestablish with doctors until 2024.     07/01/2022    8:43 AM  Depression screen PHQ 2/9  Decreased Interest 0  Down, Depressed, Hopeless 0  PHQ - 2 Score 0         No data to display           Review of Systems:   Pertinent items are noted in HPI Denies any headaches, blurred vision, fatigue, shortness of breath, chest pain, abdominal pain, abnormal vaginal discharge/itching/odor/irritation, problems with periods, bowel movements, urination, or intercourse  unless otherwise stated above. Pertinent History Reviewed:  Reviewed past medical,surgical, social and family history.  Reviewed problem list, medications and allergies. Physical Assessment:   Vitals:   08/04/22 0838 08/04/22 0845  BP: (!) 158/91 (!) 152/87  Pulse: 87 90  Weight: 223 lb (101.2 kg)   Height: 5\' 4"  (1.626 m)   Body mass index is 38.28 kg/m.        Physical Examination:   General appearance - well appearing, and in no distress  Mental status - alert, oriented to person, place, and time  Psych:  She has a normal mood and affect  Skin - warm and dry, normal color, no suspicious lesions noted  Chest - effort normal, wheezes bilaterally  Heart - normal rate and regular rhythm  Neck:  midline trachea, no thyromegaly or nodules  Breasts - breasts appear normal, no suspicious masses, no skin or nipple changes or axillary nodes  Abdomen - soft, nontender, nondistended, no masses or organomegaly  Pelvic - VULVA: normal appearing vulva with no masses, tenderness or lesions  VAGINA: normal appearing vagina with normal color and discharge, no lesions  CERVIX: normal appearing cervix without discharge or lesions, no CMT  Thin prep pap is done with HR HPV cotesting  UTERUS: uterus is felt to be normal size, shape, consistency and nontender   ADNEXA: No adnexal masses or tenderness noted  Extremities:  No swelling or varicosities noted  Chaperone present for exam  No  results found for this or any previous visit (from the past 24 hour(s)).  Assessment & Plan:  1. Well woman exam with routine gynecological exam - [phone] Colonoscopy: schedule screening colonoscopy as soon as possible; order placed by family physician  2. Screening for cervical cancer - [phone] Pap performed in clinic today - Cytology - PAP( [facility])  3. Encounter for screening mammogram for malignant neoplasm of breast - [phone] Mammogram: schedule screening mammo as soon as possible, order placed in clinic  today - MM 3D SCREENING MAMMOGRAM BILATERAL BREAST; Future  4. Essential hypertension - [id number] BP remains elevated at today's visit at 152/87. Pt reports compliance with amlodipine; also prescribed losartan, though not taking. Encouraged pt to follow up with primary physician for medication management  5. Hot flashes - [phone] Pt reports she began to experience hot flashes, weight gain, and fatigue around two years ago when she stopped having periods. Will arrange for [facility] to evaluate menopausal status. - Follicle stimulating hormone  6. Postmenopausal bleeding - 99204 Reports no menstrual periods for past 2 years up until a little over a week ago, when she had 7 days of bleeding. Concern for endometrial cancer vs precancerous lesion, especially given Fhx of cancer. Can consider intrauterine adhesions given hx of uterine ablation. Biopsy performed in clinic today. Will arrange for transvaginal US to evaluate further. - US PELVIC COMPLETE WITH TRANSVAGINAL; Future  7. Tobacco use disorder - 99204 Currently smoking 1/3-1/4 ppd. Discussed keeping journal of when she smokes/what triggers the urge, and trying alternatives such as walking, chewing sugar-free gum, or using meditation/deep breathing to fight the craving. Continue to follow with primary physician for management.   8. Family history of cancer - [phone]  Extensive history of breast, uterine, ovarian cancer on maternal side. Will arrange for genetic testing of pt - [facility] (2+17)   Orders Placed This Encounter  Procedures   MM 3D SCREENING MAMMOGRAM BILATERAL BREAST   US PELVIC COMPLETE WITH TRANSVAGINAL   Follicle stimulating hormone   [facility] Guidelines (2+17)    Meds: No orders of the defined types were placed in this encounter.   Follow-up: Return in about 4 weeks (around 09/01/2022) for a follow-up, virtual.  Amy Wade, Medical Student 08/04/2022 9:44 AM

## 2022-08-04 NOTE — Telephone Encounter (Addendum)
Spoke with pt, pt states she has been having a lot of chest pains and is having to take Nitroglycerin 3-4 times a day, when pt is experiencing chest pain she also has pain in her left arm as well as high blood pressure today at the OBGYN. Pt was advised to please contact cardiologist (pt states she had already contacted them today, on chart review I do not see where she has) or go to the ED, pt states she will go to the ED if she experiences anymore pain.    Forwarding to Dr. Audie Box to make him aware as well.

## 2022-08-05 LAB — SURGICAL PATHOLOGY

## 2022-08-05 LAB — FOLLICLE STIMULATING HORMONE: FSH: 18.4 m[IU]/mL

## 2022-08-08 LAB — CYTOLOGY - PAP
Adequacy: ABSENT
Comment: NEGATIVE
Diagnosis: NEGATIVE
High risk HPV: NEGATIVE

## 2022-08-09 ENCOUNTER — Ambulatory Visit (HOSPITAL_BASED_OUTPATIENT_CLINIC_OR_DEPARTMENT_OTHER)
Admission: RE | Admit: 2022-08-09 | Discharge: 2022-08-09 | Disposition: A | Discharge status: Home / Self Care | Payer: 59 | Source: Ambulatory Visit | Attending: Family Medicine | Admitting: Family Medicine

## 2022-08-09 ENCOUNTER — Telehealth: Payer: Self-pay | Admitting: Family

## 2022-08-09 ENCOUNTER — Encounter (HOSPITAL_BASED_OUTPATIENT_CLINIC_OR_DEPARTMENT_OTHER): Payer: Self-pay

## 2022-08-09 DIAGNOSIS — Z1231 Encounter for screening mammogram for malignant neoplasm of breast: Secondary | ICD-10-CM | POA: Diagnosis present

## 2022-08-09 DIAGNOSIS — N950 Postmenopausal bleeding: Secondary | ICD-10-CM | POA: Diagnosis present

## 2022-08-09 NOTE — Telephone Encounter (Signed)
Called pt, was not able to leave a VM.  

## 2022-08-09 NOTE — Telephone Encounter (Addendum)
Spoke with pt, pt states she is still having chest pains, Pt states she is a current smoker and notices when she's not smoking the pain is less frequent. Pt still continues to take 4 nitroglycerin a day and now needs a refill. pt was advised again to go to the ED, pt refused but did agree to come in for an office visit, pt is scheduled for 08/10/2022.

## 2022-08-09 NOTE — Telephone Encounter (Signed)
Please see phone note  

## 2022-08-09 NOTE — Telephone Encounter (Signed)
We need to call and check on her- I saw her e-mail and her cardiologist recommended ER evaluation. Did she go? With those symptoms and her history, the concern would be for another MI. She needs to go to ER unless she has been able to see her cardiologist.

## 2022-08-09 NOTE — Telephone Encounter (Signed)
Called pt, pt did not answer and I was not able to leave a VM.

## 2022-08-10 ENCOUNTER — Ambulatory Visit (INDEPENDENT_AMBULATORY_CARE_PROVIDER_SITE_OTHER): Payer: 59 | Admitting: Family Medicine

## 2022-08-10 ENCOUNTER — Emergency Department (HOSPITAL_BASED_OUTPATIENT_CLINIC_OR_DEPARTMENT_OTHER): Payer: 59

## 2022-08-10 ENCOUNTER — Observation Stay (HOSPITAL_BASED_OUTPATIENT_CLINIC_OR_DEPARTMENT_OTHER)
Admission: EM | Admit: 2022-08-10 | Discharge: 2022-08-12 | Disposition: A | Discharge status: Home / Self Care | Payer: 59 | Attending: Internal Medicine | Admitting: Internal Medicine

## 2022-08-10 ENCOUNTER — Other Ambulatory Visit: Payer: Self-pay

## 2022-08-10 ENCOUNTER — Encounter (HOSPITAL_COMMUNITY): Payer: Self-pay | Admitting: Internal Medicine

## 2022-08-10 VITALS — BP 125/94 | HR 70 | Ht 64.0 in | Wt 224.0 lb

## 2022-08-10 DIAGNOSIS — I252 Old myocardial infarction: Secondary | ICD-10-CM

## 2022-08-10 DIAGNOSIS — I472 Ventricular tachycardia, unspecified: Secondary | ICD-10-CM | POA: Insufficient documentation

## 2022-08-10 DIAGNOSIS — R072 Precordial pain: Secondary | ICD-10-CM | POA: Diagnosis not present

## 2022-08-10 DIAGNOSIS — I4729 Other ventricular tachycardia: Secondary | ICD-10-CM

## 2022-08-10 DIAGNOSIS — R0789 Other chest pain: Secondary | ICD-10-CM | POA: Diagnosis not present

## 2022-08-10 DIAGNOSIS — E876 Hypokalemia: Secondary | ICD-10-CM

## 2022-08-10 DIAGNOSIS — E785 Hyperlipidemia, unspecified: Secondary | ICD-10-CM | POA: Diagnosis not present

## 2022-08-10 DIAGNOSIS — Z23 Encounter for immunization: Secondary | ICD-10-CM | POA: Diagnosis not present

## 2022-08-10 DIAGNOSIS — R079 Chest pain, unspecified: Secondary | ICD-10-CM

## 2022-08-10 DIAGNOSIS — I25119 Atherosclerotic heart disease of native coronary artery with unspecified angina pectoris: Secondary | ICD-10-CM | POA: Diagnosis present

## 2022-08-10 DIAGNOSIS — E669 Obesity, unspecified: Secondary | ICD-10-CM

## 2022-08-10 DIAGNOSIS — Z79899 Other long term (current) drug therapy: Secondary | ICD-10-CM | POA: Diagnosis not present

## 2022-08-10 DIAGNOSIS — I10 Essential (primary) hypertension: Secondary | ICD-10-CM | POA: Diagnosis not present

## 2022-08-10 DIAGNOSIS — F17210 Nicotine dependence, cigarettes, uncomplicated: Secondary | ICD-10-CM | POA: Diagnosis not present

## 2022-08-10 DIAGNOSIS — I25118 Atherosclerotic heart disease of native coronary artery with other forms of angina pectoris: Secondary | ICD-10-CM | POA: Diagnosis not present

## 2022-08-10 DIAGNOSIS — F17200 Nicotine dependence, unspecified, uncomplicated: Secondary | ICD-10-CM | POA: Diagnosis present

## 2022-08-10 LAB — CBC WITH DIFFERENTIAL/PLATELET
Abs Immature Granulocytes: 0.02 10*3/uL (ref 0.00–0.07)
Basophils Absolute: 0 10*3/uL (ref 0.0–0.1)
Basophils Relative: 0 %
Eosinophils Absolute: 0.3 10*3/uL (ref 0.0–0.5)
Eosinophils Relative: 3 %
HCT: 42.4 % (ref 36.0–46.0)
Hemoglobin: 14 g/dL (ref 12.0–15.0)
Immature Granulocytes: 0 %
Lymphocytes Relative: 36 %
Lymphs Abs: 3.5 10*3/uL (ref 0.7–4.0)
MCH: 27.5 pg (ref 26.0–34.0)
MCHC: 33 g/dL (ref 30.0–36.0)
MCV: 83.3 fL (ref 80.0–100.0)
Monocytes Absolute: 0.7 10*3/uL (ref 0.1–1.0)
Monocytes Relative: 7 %
Neutro Abs: 5.1 10*3/uL (ref 1.7–7.7)
Neutrophils Relative %: 54 %
Platelets: 452 10*3/uL — ABNORMAL HIGH (ref 150–400)
RBC: 5.09 MIL/uL (ref 3.87–5.11)
RDW: 14.5 % (ref 11.5–15.5)
WBC: 9.6 10*3/uL (ref 4.0–10.5)
nRBC: 0 % (ref 0.0–0.2)

## 2022-08-10 LAB — COMPREHENSIVE METABOLIC PANEL
ALT: 12 U/L (ref 0–44)
AST: 15 U/L (ref 15–41)
Albumin: 3.7 g/dL (ref 3.5–5.0)
Alkaline Phosphatase: 94 U/L (ref 38–126)
Anion gap: 7 (ref 5–15)
BUN: 8 mg/dL (ref 6–20)
CO2: 26 mmol/L (ref 22–32)
Calcium: 8.4 mg/dL — ABNORMAL LOW (ref 8.9–10.3)
Chloride: 103 mmol/L (ref 98–111)
Creatinine, Ser: 0.75 mg/dL (ref 0.44–1.00)
GFR, Estimated: >60 mL/min (ref >60)
Glucose, Bld: 99 mg/dL (ref 70–99)
Potassium: 3.4 mmol/L — ABNORMAL LOW (ref 3.5–5.1)
Sodium: 136 mmol/L (ref 135–145)
Total Bilirubin: 0.3 mg/dL (ref 0.3–1.2)
Total Protein: 6.7 g/dL (ref 6.5–8.1)

## 2022-08-10 LAB — HIV ANTIBODY (ROUTINE TESTING W REFLEX): HIV Screen 4th Generation wRfx: NONREACTIVE

## 2022-08-10 LAB — TROPONIN I (HIGH SENSITIVITY)
Troponin I (High Sensitivity): 8 ng/L (ref <18)
Troponin I (High Sensitivity): 7 ng/L (ref <18)

## 2022-08-10 LAB — LIPASE, BLOOD: Lipase: 39 U/L (ref 11–51)

## 2022-08-10 LAB — D-DIMER, QUANTITATIVE: D-Dimer, Quant: <0.27 ug/mL-FEU (ref 0.00–0.50)

## 2022-08-10 LAB — MAGNESIUM: Magnesium: 1.8 mg/dL (ref 1.7–2.4)

## 2022-08-10 MED ORDER — LOSARTAN POTASSIUM 50 MG PO TABS
50.0000 mg | ORAL_TABLET | Freq: Every day | ORAL | Status: DC
  Administered 2022-08-11 – 2022-08-12 (×2): 50 mg via ORAL
  Filled 2022-08-10 (×2): qty 1

## 2022-08-10 MED ORDER — ASPIRIN 81 MG PO TBEC
81.0000 mg | DELAYED_RELEASE_TABLET | Freq: Every day | ORAL | Status: DC
  Administered 2022-08-10 – 2022-08-12 (×3): 81 mg via ORAL
  Filled 2022-08-10 (×4): qty 1

## 2022-08-10 MED ORDER — ISOSORBIDE MONONITRATE ER 30 MG PO TB24
30.0000 mg | ORAL_TABLET | Freq: Every day | ORAL | Status: DC
  Administered 2022-08-10 – 2022-08-11 (×2): 30 mg via ORAL
  Filled 2022-08-10 (×2): qty 1

## 2022-08-10 MED ORDER — NICOTINE 14 MG/24HR TD PT24
14.0000 mg | MEDICATED_PATCH | Freq: Every day | TRANSDERMAL | Status: DC
  Administered 2022-08-10 – 2022-08-11 (×2): 14 mg via TRANSDERMAL
  Filled 2022-08-10 (×2): qty 1

## 2022-08-10 MED ORDER — ISOSORBIDE MONONITRATE ER 30 MG PO TB24: 30.0000 mg | ORAL_TABLET | Freq: Every day | ORAL | Status: DC

## 2022-08-10 MED ORDER — CALCIUM CARBONATE ANTACID 500 MG PO CHEW
1.0000 | CHEWABLE_TABLET | Freq: Once | ORAL | Status: AC
  Administered 2022-08-10: 200 mg via ORAL
  Filled 2022-08-10: qty 1

## 2022-08-10 MED ORDER — ACETAMINOPHEN 325 MG PO TABS
650.0000 mg | ORAL_TABLET | ORAL | Status: DC | PRN
  Administered 2022-08-11 (×3): 650 mg via ORAL
  Filled 2022-08-10 (×3): qty 2

## 2022-08-10 MED ORDER — ENOXAPARIN SODIUM 40 MG/0.4ML IJ SOSY
40.0000 mg | PREFILLED_SYRINGE | INTRAMUSCULAR | Status: DC
  Administered 2022-08-10 – 2022-08-11 (×2): 40 mg via SUBCUTANEOUS
  Filled 2022-08-10 (×2): qty 0.4

## 2022-08-10 MED ORDER — PNEUMOCOCCAL 20-VAL CONJ VACC 0.5 ML IM SUSY
0.5000 mL | PREFILLED_SYRINGE | INTRAMUSCULAR | Status: AC
  Administered 2022-08-12: 0.5 mL via INTRAMUSCULAR
  Filled 2022-08-10: qty 0.5

## 2022-08-10 MED ORDER — IPRATROPIUM-ALBUTEROL 0.5-2.5 (3) MG/3ML IN SOLN
3.0000 mL | Freq: Once | RESPIRATORY_TRACT | Status: AC
  Administered 2022-08-10: 3 mL via RESPIRATORY_TRACT
  Filled 2022-08-10: qty 3

## 2022-08-10 MED ORDER — ROSUVASTATIN CALCIUM 20 MG PO TABS
20.0000 mg | ORAL_TABLET | Freq: Every day | ORAL | Status: DC
  Administered 2022-08-11: 20 mg via ORAL
  Filled 2022-08-10: qty 1

## 2022-08-10 MED ORDER — AMLODIPINE BESYLATE 5 MG PO TABS
5.0000 mg | ORAL_TABLET | Freq: Every day | ORAL | Status: DC
  Administered 2022-08-11: 5 mg via ORAL
  Filled 2022-08-10: qty 1

## 2022-08-10 MED ORDER — ONDANSETRON HCL 4 MG/2ML IJ SOLN: 4.0000 mg | Freq: Four times a day (QID) | INTRAMUSCULAR | Status: DC | PRN

## 2022-08-10 MED ORDER — POTASSIUM CHLORIDE CRYS ER 20 MEQ PO TBCR
40.0000 meq | EXTENDED_RELEASE_TABLET | Freq: Once | ORAL | Status: AC
  Administered 2022-08-10: 40 meq via ORAL
  Filled 2022-08-10: qty 2

## 2022-08-10 MED ORDER — MELATONIN 5 MG PO TABS
5.0000 mg | ORAL_TABLET | Freq: Every day | ORAL | Status: AC
  Administered 2022-08-10: 5 mg via ORAL
  Filled 2022-08-10: qty 1

## 2022-08-10 MED ORDER — HYDRALAZINE HCL 20 MG/ML IJ SOLN: 5.0000 mg | Freq: Four times a day (QID) | INTRAMUSCULAR | Status: DC | PRN

## 2022-08-10 NOTE — Progress Notes (Signed)
Acute Office Visit  Subjective:     Patient ID: Amy Wade, female    DOB: Nov 11, 1970, 52 y.o.   MRN: [id number]  Chief Complaint  Patient presents with   Chest Pain     Patient is in today for chest pains.   Lorenzo has been having recurrent chest pains for the past several weeks. PCP referred her to cardiology and she saw them on 07/26/22. They ordered stress test and echo - not yet done. EKG at their visit was abnormal (inferior infarct similar to previous EKG and possibly new inferolateral T wave inversions). She has a history of PCI in 2012 and normal heart cath in 2016. BP was elevated. They recommended she stop smoking and start taking aspirin, losartan 50 mg daily, Crestor 20 mg daily (goal LDL <55), and also gave her PRN sublingual nitroglycerin with instructions for use. She was instructed to continue her amlodipine 5 mg daily.  For the past several days, patient has continued with frequent chest pains (using SL nitro 3-4 times daily) and called our office on 08/04/22 to update PCP. Our staff recommended ED evaluation and forwarded message to cardiologist, Dr. Audie Box, who also agreed she should be evaluated in the ED, but she declined.   Reports she had been using SL nitro 3-4x/day until running out yesterday. Since last night, she has had 5 chest pain episodes (3 that woke her up during the night, and 2 this morning, one while CMA was getting EKG set up). Reports the pain is not related to meals or activity. Episodes have been gradually becoming more frequent. States when the pain comes, it will last 2-3 minutes, described as 10/10 pressure like something is sitting on her chest. Reports pain radiates into left shoulder/arm and makes her feel like she can't take a full breath. She denies any diaphoresis or lightheadedness during episodes. Reports she quit smoking 4 days ago. She has been taking her Crestor and losartan, but not the baby aspirin or amlodipine.       All  review of systems negative except what is listed in the HPI      Objective:    BP (!) 125/94   Pulse 70   Ht 5\' 4"  (1.626 m)   Wt 224 lb (101.6 kg)   LMP 07/26/2022   SpO2 96%   BMI 38.45 kg/m    Physical Exam Vitals reviewed.  Constitutional:      Appearance: Normal appearance.  Cardiovascular:     Rate and Rhythm: Normal rate and regular rhythm.     Pulses: Normal pulses.     Heart sounds: Normal heart sounds.  Pulmonary:     Effort: Pulmonary effort is normal.     Breath sounds: Normal breath sounds.  Skin:    General: Skin is warm and dry.  Neurological:     Mental Status: She is alert and oriented to person, place, and time.  Psychiatric:        Mood and Affect: Mood normal.        Behavior: Behavior normal.        Thought Content: Thought content normal.        Judgment: Judgment normal.        No results found for any visits on 08/10/22.      Assessment & Plan:   Problem List Items Addressed This Visit     History of MI (myocardial infarction)   Relevant Orders   EKG 12-Lead (Completed)   Other Visit Diagnoses  Chest pain, unspecified type    -  Primary   Relevant Orders   EKG 12-Lead (Completed)      EKG: NSR, inverted T waves, anterior infarct age undetermined, inferior infarct old Given symptoms, severity, and frequency, advised she go to the ED. She is agreeable.  Called [facility] ED to make them aware. Escorted by CMA.    No orders of the defined types were placed in this encounter.   Return for see PCP for follow-up after ED discharge .  Terrilyn Saver, NP

## 2022-08-10 NOTE — ED Provider Notes (Signed)
Emergency Department Provider Note   I have reviewed the triage vital signs and the nursing notes.   HISTORY  Chief Complaint Chest Pain   HPI Amy Wade is a 52 y.o. female with past history of hypertension, hyperlipidemia, CAD with report of prior stenting in 2016 but no report for review presents to the emergency department with continued intermittent chest pain.  She has had this episode on and off and has established care with [facility] for Cardiology follow up.  Pain is to the center of her chest and radiates to her left shoulder.  No nausea or diaphoresis.  Symptoms occur at rest and without clear provocation.  No clear pleuritic pain.  She is been taking her home medications including nitroglycerin given at her recent PCP appointment and has since taken all the tablets.  She does note that it does seem to improve the pain symptoms.   Past Medical History:  Diagnosis Date   Coronary artery disease    Hyperlipidemia    Hypertension    Myocardial infarction    Vaginal Pap smear, abnormal     Review of Systems  Constitutional: No fever/chills Cardiovascular: Positive chest pain. Respiratory: Denies shortness of breath. Gastrointestinal: No abdominal pain.  No nausea, no vomiting.  No diarrhea.  No constipation. Genitourinary: Negative for dysuria. Musculoskeletal: Negative for back pain. Skin: Negative for rash. Neurological: Negative for headaches. ____________________________________________   PHYSICAL EXAM:  VITAL SIGNS: ED Triage Vitals  Enc Vitals Group     BP 08/10/22 0927 (!) 164/98     Pulse Rate 08/10/22 0927 71     Resp 08/10/22 0927 15     Temp 08/10/22 0927 97.8 F (36.6 C)     Temp Source 08/10/22 0927 Oral     SpO2 08/10/22 0927 98 %     Weight 08/10/22 0930 226 lb (102.5 kg)     Height 08/10/22 0930 5\' 4"  (1.626 m)   Constitutional: Alert and oriented. Well appearing and in no acute distress. Eyes: Conjunctivae are normal. Head:  Atraumatic. Nose: No congestion/rhinnorhea. Mouth/Throat: Mucous membranes are moist. Neck: No stridor.   Cardiovascular: Normal rate, regular rhythm. Good peripheral circulation. Grossly normal heart sounds.   Respiratory: Normal respiratory effort.  No retractions. Lungs CTAB. Gastrointestinal: Soft and nontender. No distention.  Musculoskeletal: No lower extremity tenderness nor edema. No gross deformities of extremities. Neurologic:  Normal speech and language.  Skin:  Skin is warm, dry and intact. No rash noted.   ____________________________________________   LABS (all labs ordered are listed, but only abnormal results are displayed)  Labs Reviewed  COMPREHENSIVE METABOLIC PANEL - Abnormal; Notable for the following components:      Result Value   Potassium 3.4 (*)    Calcium 8.4 (*)    All other components within normal limits  CBC WITH DIFFERENTIAL/PLATELET - Abnormal; Notable for the following components:   Platelets 452 (*)    All other components within normal limits  LIPASE, BLOOD  D-DIMER, QUANTITATIVE  MAGNESIUM  HIV ANTIBODY (ROUTINE TESTING W REFLEX)  TROPONIN I (HIGH SENSITIVITY)  TROPONIN I (HIGH SENSITIVITY)   ____________________________________________  EKG   EKG Interpretation  Date/Time:  Wednesday August 10 2022 09:23:47 EDT Ventricular Rate:  70 PR Interval:  167 QRS Duration: 95 QT Interval:  415 QTC Calculation: 448 R Axis:   -3 Text Interpretation: Sinus rhythm Borderline low voltage, extremity leads Abnormal T, consider ischemia, lateral leads No old trcing for comparison Confirmed by Nanda Quinton [phone]) on 08/10/2022  9:27:36 AM        ____________________________________________  RADIOLOGY  DG Chest 2 View  Result Date: 08/10/2022 CLINICAL DATA:  Chest pain EXAM: CHEST - 2 VIEW COMPARISON:  August 16, 2021 FINDINGS: The cardiomediastinal silhouette is unchanged in contour. No focal pulmonary opacity. No pleural effusion or  pneumothorax. The visualized upper abdomen is unremarkable. No acute osseous abnormality. IMPRESSION: No active cardiopulmonary disease. Electronically Signed   By: Beryle Flock M.D.   On: 08/10/2022 10:16    ____________________________________________   PROCEDURES  Procedure(s) performed:   Procedures  None ____________________________________________   INITIAL IMPRESSION / ASSESSMENT AND PLAN / ED COURSE  Pertinent labs & imaging results that were available during my care of the patient were reviewed by me and considered in my medical decision making (see chart for details).   This patient is Presenting for Evaluation of CP, which does require a range of treatment options, and is a complaint that involves a high risk of morbidity and mortality.  The Differential Diagnoses includes but is not exclusive to acute coronary syndrome, aortic dissection, pulmonary embolism, cardiac tamponade, community-acquired pneumonia, pericarditis, musculoskeletal chest wall pain, etc.   Critical Interventions-    Medications  pneumococcal 20-valent conjugate vaccine (PREVNAR 20) injection 0.5 mL (has no administration in time range)  acetaminophen (TYLENOL) tablet 650 mg (650 mg Oral Given 08/11/22 0406)  ondansetron (ZOFRAN) injection 4 mg (has no administration in time range)  enoxaparin (LOVENOX) injection 40 mg (40 mg Subcutaneous Given 08/10/22 2102)  aspirin EC tablet 81 mg (81 mg Oral Given 08/10/22 2103)  nicotine (NICODERM CQ - dosed in mg/24 hours) patch 14 mg (14 mg Transdermal Patch Applied 08/10/22 2102)  losartan (COZAAR) tablet 50 mg (has no administration in time range)  amLODipine (NORVASC) tablet 5 mg (has no administration in time range)  rosuvastatin (CRESTOR) tablet 20 mg (has no administration in time range)  hydrALAZINE (APRESOLINE) injection 5 mg (has no administration in time range)  isosorbide mononitrate (IMDUR) 24 hr tablet 30 mg (30 mg Oral Given 08/10/22 2103)  potassium  chloride SA (KLOR-CON M) CR tablet 40 mEq (40 mEq Oral Given 08/10/22 2102)  calcium carbonate (TUMS - dosed in mg elemental calcium) chewable tablet 200 mg of elemental calcium (200 mg of elemental calcium Oral Given 08/10/22 2103)  melatonin tablet 5 mg (5 mg Oral Given 08/10/22 2103)  ipratropium-albuterol (DUONEB) 0.5-2.5 (3) MG/3ML nebulizer solution 3 mL (3 mLs Nebulization Given 08/10/22 2036)  morphine (PF) 2 MG/ML injection 1 mg (1 mg Intravenous Given 08/11/22 0434)    Reassessment after intervention:  No return of NSVT.    I decided to review pertinent External Data, and in summary patient with recent [facility] note to establish care. Unable to see cath from 2016.   Clinical Laboratory Tests Ordered, included troponin negative x 2. D dimer negative. No AKI. No anemia.   Radiologic Tests Ordered, included CXR. I independently interpreted the images and agree with radiology interpretation.   Cardiac Monitor Tracing which shows NSR.    Social Determinants of Health Risk patient quit smoking 4 days prior.   Consult complete with Dr. Ellyn Hack. Cardiology can consult but with flat troponin would not admit primarily.   TRH. Plan for admit.   Medical Decision Making: Summary:  Patient presents to the emergency department for evaluation of chest pain.  It is intermittent pain over the past week but in review of cardiology note from earlier in March she has been having intermittent chest pain at  rest during that appointment as well.   Reevaluation with update and discussion with patient.  She had 3 beats of nonsustained V. tach while in the ER but nothing persistent or symptomatic.  Magnesium normal.  Discussed the case with cardiology as above with plan for [facility] admit and cardiology consultation.  Patient's presentation is most consistent with acute presentation with potential threat to life or bodily function.   Disposition: admit  ____________________________________________  FINAL CLINICAL  IMPRESSION(S) / ED DIAGNOSES  Final diagnoses:  Precordial chest pain  NSVT (nonsustained ventricular tachycardia)    Note:  This document was prepared using Dragon voice recognition software and may include unintentional dictation errors.  Nanda Quinton, MD, [facility] Emergency Medicine    Keerat Denicola, [facility], MD 08/11/22 [phone]

## 2022-08-10 NOTE — Assessment & Plan Note (Addendum)
-  reports quitting 4 days ago but was smoking a pack/3 days -nicotine patch ordered -encourage continual cessation

## 2022-08-10 NOTE — Assessment & Plan Note (Signed)
continue statin

## 2022-08-10 NOTE — H&P (Signed)
History and Physical    Patient: Amy Wade [id number] DOB: June 06, 1970 DOA: 08/10/2022 DOS: the patient was seen and examined on 08/10/2022 PCP: Amy Salvage, FNP  Patient coming from:  [facility]  Chief Complaint:  Chief Complaint  Patient presents with   Chest Pain   HPI: Amy Wade is a 52 y.o. female with medical history significant of HTN, HLD, CAD s/p PCI who presents as a transfer for chest pain.   Patient was evaluated by cardiology outpatient by Dr. Nori Riis on 07/26/22 after 2 weeks of ongoing intermittent chest pain. Pain was atypical. Not alleviated by rest or worse with exertion. Not associated with food. BP was noted to be 182 and she was not compliant with her medication and continues to smoke. EKG at the time with new inferolateral T wave inversion that cardiology though symptoms possibly more related to her uncontrolled HTN. She was started on aspirin, Crestor, Losartan 50mg  and asked to continue her amlodipine 5mg . Also given nitro SL.  She was arranged to have PET stress test and echocardiogram which she has not yet undergo.   Reports compliance with her blood pressure and statin. Has not started aspirin. Chest pain has been ongoing for weeks and is getting worse. Last night woke her out of her sleep. Pain happens at rest and with exertion. Left sided pressure with associated shortness of breath. Feels it in her back as well. No nausea/vomiting. Has been taking nitroglycerin up to 4x daily but now ran out. Get immediate relief but pain comes back in a few house. Feels similar to her MI in 2012. She has quit smoking for 4 days now but was smoking up to a pack every 3 days. Denies illicit drug use.   In the ED, afebrile, BP elevated to 160/90. No leukocytosis or anemia.  Mild hypokalemia of 3.4 with an hypocalcemia of 8.4.  Normal magnesium. LFTs and lipase otherwise within normal limits.  Troponin was flat at 8 and 7.  D-dimer was negative.  EKG on my  review remained similar to prior with inferior lateral T wave inversion.   Review of Systems: As mentioned in the history of present illness. All other systems reviewed and are negative. Past Medical History:  Diagnosis Date   Coronary artery disease    Hyperlipidemia    Hypertension    Myocardial infarction    Vaginal Pap smear, abnormal    Past Surgical History:  Procedure Laterality Date   CARDIAC CATHETERIZATION     ECTOPIC PREGNANCY SURGERY     Social History:  reports that she has been smoking cigarettes. She has a 10.00 pack-year smoking history. She does not have any smokeless tobacco history on file. She reports current alcohol use. She reports that she does not use drugs.  Allergies  Allergen Reactions   Ivp Dye [Iodinated Contrast Media]     Family History  Problem Relation Age of Onset   Heart disease Father     Prior to Admission medications   Medication Sig Start Date End Date Taking? Authorizing Provider  amLODipine (NORVASC) 5 MG tablet Take 1 tablet (5 mg total) by mouth daily. Patient not taking: Reported on 08/10/2022 07/01/22   Amy Salvage, FNP  aspirin EC 81 MG tablet Take 1 tablet (81 mg total) by mouth daily. Swallow whole. 07/26/22   O'NealCassie Freer, MD  losartan (COZAAR) 50 MG tablet Take 1 tablet (50 mg total) by mouth daily. 07/26/22 07/21/23  O'NealCassie Freer, MD  nitroGLYCERIN (NITROSTAT) 0.4  MG SL tablet Place 1 tablet (0.4 mg total) under the tongue every 5 (five) minutes as needed for chest pain. 07/26/22 10/24/22  O'NealCassie Freer, MD  rosuvastatin (CRESTOR) 20 MG tablet Take 1 tablet (20 mg total) by mouth daily. 07/26/22   Geralynn Rile, MD    Physical Exam: Vitals:   08/10/22 1709 08/10/22 1834 08/10/22 1922 08/10/22 2000  BP: (!) 152/78 (!) 159/93  (!) 153/72  Pulse: 80 74 77 67  Resp: 18 16  17   Temp: 98.1 F (36.7 C) 97.7 F (36.5 C)  98.3 F (36.8 C)  TempSrc: Oral Oral  Oral  SpO2: 99% 98% 100% 98%   Weight:      Height:       Constitutional: NAD, calm, comfortable, well-appearing obese female sitting upright in bed Eyes: lids and conjunctivae normal ENMT: Mucous membranes are moist.  Neck: normal, supple Respiratory: Diffuse faint expiratory wheezes but no crackles.  Normal respiratory effort. No accessory muscle use.  On room air. Cardiovascular: Regular rate and rhythm, no murmurs / rubs / gallops.  No chest wall pain with palpation.  No extremity edema.  Abdomen: no tenderness especially to epigastric region, no masses palpated.  Musculoskeletal: no clubbing / cyanosis. No joint deformity upper and lower extremities. Good ROM, no contractures. Normal muscle tone.  Skin: no rashes, lesions, ulcers.  Neurologic: CN 2-12 grossly intact.  Psychiatric: Normal judgment and insight. Alert and oriented x 3. Normal mood. Data Reviewed:  See HPI  Assessment and Plan: * Chest pain -hx of CAD s/p PCI RCA in 2012 with reported [place] in 2016 which showed no evidence of new coronary lesions and widely patent right coronary artery stent.  -Troponin reassuring and flat at 8-7. EKG on my review remained similar to prior to several weeks ago with inferior lateral T wave inversion. -chest pain ongoing for at least a month. Concerning for possible angina symptom with her uncontrolled HTN. Will initiate Imdur 30mg  a and assess both her symptom and BP -Continue good BP control with goal 130/80 -obtain Echocardiogram -sent message to Cardiology to consult. Keep NPO after midnight as she may need stress test which was recommended by cardiology outpatient  Obesity (BMI 30-39.9) BMI of 38  Hypocalcemia -replete with oral calcium  Hypokalemia -replete with oral potassium supplement  Hyperlipidemia -continue statin  Tobacco use disorder -reports quitting 4 days ago but was smoking a pack/3 days -nicotine patch ordered -encourage continual cessation  Essential hypertension -uncontrolled.  Questions compliance.  -continue amlodipine and Losartan -started on Imdur -PRN hydralazine for SBP >160 -If BP remains uncontrol, could increase amlodipine to max dose or add in beta-blocker      Advance Care Planning:Full  Consults: Message sent to cardiology Gay Filler to consult tomorrow  Family Communication: none at bedside  Severity of Illness: The appropriate patient status for this patient is OBSERVATION. Observation status is judged to be reasonable and necessary in order to provide the required intensity of service to ensure the patient's safety. The patient's presenting symptoms, physical exam findings, and initial radiographic and laboratory data in the context of their medical condition is felt to place them at decreased risk for further clinical deterioration. Furthermore, it is anticipated that the patient will be medically stable for discharge from the hospital within 2 midnights of admission.   Author: Orene Desanctis, DO 08/10/2022 8:34 PM  For on call review [email or web address].

## 2022-08-10 NOTE — Plan of Care (Addendum)
[facility] transfer Amy Wade is a 52 year old female with pmh HTN, HLD, and CAD who presented with c/o chest pain at rest.  High-sensitivity troponins were negative x 2.  EKG without significant ischemic changes.  Patient was noted to have short episode of VT.  Patient had already seen cardiology in the outpatient setting last month and had been set up to have echocardiogram and possibly stress test.  Will need to formally consult [facility] cardiology upon admission.  Orders placed for observation to a cardiac telemetry bed.

## 2022-08-10 NOTE — Assessment & Plan Note (Signed)
BMI of 38. 

## 2022-08-10 NOTE — Assessment & Plan Note (Signed)
-  replete with oral calcium

## 2022-08-10 NOTE — Patient Instructions (Signed)
ED evaluation recommended

## 2022-08-10 NOTE — Assessment & Plan Note (Addendum)
-  hx of CAD s/p PCI RCA in 2012 with reported [place] in 2016 which showed no evidence of new coronary lesions and widely patent right coronary artery stent.  -Troponin reassuring and flat at 8-7. EKG on my review remained similar to prior to several weeks ago with inferior lateral T wave inversion. -chest pain ongoing for at least a month. Concerning for possible angina symptom with her uncontrolled HTN. Will initiate Imdur 30mg  a and assess both her symptom and BP -Continue good BP control with goal 130/80 -obtain Echocardiogram -sent message to Cardiology to consult. Keep NPO after midnight as she may need stress test which was recommended by cardiology outpatient

## 2022-08-10 NOTE — Assessment & Plan Note (Signed)
-  replete with oral potassium supplement

## 2022-08-10 NOTE — ED Notes (Signed)
ED TO INPATIENT HANDOFF REPORT  ED Nurse Name and Phone #: Orpah Cobb Name/Age/Gender Amy Wade 52 y.o. female Room/Bed: MH04/MH04  Code Status   Code Status: Not on file  Home/SNF/Other Home Patient oriented to: self, place, time, and situation Is this baseline? Yes   Triage Complete: Triage complete  Chief Complaint Chest pain [R07.9]  Triage Note Patient presents to ED via POV from home. Here with intermittent chest pain x 1 week. Reports pain is on the left side of her chest but radiates into her back, where her shoulder blade is. Denies nausea or vomiting. Reports shortness of breath when chest pain is present. History of cardiac stent.    Allergies Allergies  Allergen Reactions   Ivp Dye [Iodinated Contrast Media]     Level of Care/Admitting Diagnosis ED Disposition     ED Disposition  Admit   Condition  --   Comment  Hospital Area: [facility] [100100]  Level of Care: Telemetry Cardiac [103]  Interfacility transfer: Yes  May place patient in observation at [facility] or [place] if equivalent level of care is available:: No  Covid Evaluation: Asymptomatic - no recent exposure (last 10 days) testing not required  Diagnosis: Chest pain [id number]  Admitting Physician: Norval Morton [id number]  Attending Physician: Norval Morton [id number]          B Medical/Surgery History Past Medical History:  Diagnosis Date   Coronary artery disease    Hyperlipidemia    Hypertension    Myocardial infarction    Vaginal Pap smear, abnormal    Past Surgical History:  Procedure Laterality Date   CARDIAC CATHETERIZATION     ECTOPIC PREGNANCY SURGERY       A IV Location/Drains/Wounds Patient Lines/Drains/Airways Status     Active Line/Drains/Airways     Name Placement date Placement time Site Days   Peripheral IV 08/10/22 20 G Left Antecubital 08/10/22  1027  Antecubital  less than 1            Intake/Output Last 24  hours No intake or output data in the 24 hours ending 08/10/22 1613  Labs/Imaging Results for orders placed or performed during the hospital encounter of 08/10/22 (from the past 48 hour(s))  Comprehensive metabolic panel     Status: Abnormal   Collection Time: 08/10/22 10:20 AM  Result Value Ref Range   Sodium 136 135 - 145 mmol/L   Potassium 3.4 (L) 3.5 - 5.1 mmol/L   Chloride 103 98 - 111 mmol/L   CO2 26 22 - 32 mmol/L   Glucose, Bld 99 70 - 99 mg/dL    Comment: Glucose reference range applies only to samples taken after fasting for at least 8 hours.   BUN 8 6 - 20 mg/dL   Creatinine, Ser 0.75 0.44 - 1.00 mg/dL   Calcium 8.4 (L) 8.9 - 10.3 mg/dL   Total Protein 6.7 6.5 - 8.1 g/dL   Albumin 3.7 3.5 - 5.0 g/dL   AST 15 15 - 41 U/L   ALT 12 0 - 44 U/L   Alkaline Phosphatase 94 38 - 126 U/L   Total Bilirubin 0.3 0.3 - 1.2 mg/dL   GFR, Estimated >60 >60 mL/min    Comment: (NOTE) Calculated using the CKD-EPI Creatinine Equation (2021)    Anion gap 7 5 - 15    Comment: Performed at [facility], [address] [place], [place] [address]  Lipase, blood  Status: None   Collection Time: 08/10/22 10:20 AM  Result Value Ref Range   Lipase 39 11 - 51 U/L    Comment: Performed at [facility], [address] [place], [place] [address]  Troponin I (High Sensitivity)     Status: None   Collection Time: 08/10/22 10:20 AM  Result Value Ref Range   Troponin I (High Sensitivity) 8 <18 ng/L    Comment: (NOTE) Elevated high sensitivity troponin I (hsTnI) values and significant  changes across serial measurements may suggest ACS but many other  chronic and acute conditions are known to elevate hsTnI results.  Refer to the "Links" section for chest pain algorithms and additional  guidance. Performed at [facility], [address] [place], [place] [address]   CBC with Differential     Status: Abnormal   Collection Time: 08/10/22 10:20 AM  Result Value  Ref Range   WBC 9.6 4.0 - 10.5 K/uL   RBC 5.09 3.87 - 5.11 MIL/uL   Hemoglobin 14.0 12.0 - 15.0 g/dL   HCT 42.4 36.0 - 46.0 %   MCV 83.3 80.0 - 100.0 fL   MCH 27.5 26.0 - 34.0 pg   MCHC 33.0 30.0 - 36.0 g/dL   RDW 14.5 11.5 - 15.5 %   Platelets 452 (H) 150 - 400 K/uL   nRBC 0.0 0.0 - 0.2 %   Neutrophils Relative % 54 %   Neutro Abs 5.1 1.7 - 7.7 K/uL   Lymphocytes Relative 36 %   Lymphs Abs 3.5 0.7 - 4.0 K/uL   Monocytes Relative 7 %   Monocytes Absolute 0.7 0.1 - 1.0 K/uL   Eosinophils Relative 3 %   Eosinophils Absolute 0.3 0.0 - 0.5 K/uL   Basophils Relative 0 %   Basophils Absolute 0.0 0.0 - 0.1 K/uL   Immature Granulocytes 0 %   Abs Immature Granulocytes 0.02 0.00 - 0.07 K/uL    Comment: Performed at [facility], [address] [place], [place] [address]  D-dimer, quantitative     Status: None   Collection Time: 08/10/22 10:20 AM  Result Value Ref Range   D-Dimer, Quant <0.27 0.00 - 0.50 ug/mL-FEU    Comment: (NOTE) At the manufacturer cut-off value of 0.5 g/mL FEU, this assay has a negative predictive value of 95-100%.This assay is intended for use in conjunction with a clinical pretest probability (PTP) assessment model to exclude pulmonary embolism (PE) and deep venous thrombosis (DVT) in outpatients suspected of PE or DVT. Results should be correlated with clinical presentation. Performed at [facility], [address], [place], [place] [address]   Magnesium     Status: None   Collection Time: 08/10/22 10:20 AM  Result Value Ref Range   Magnesium 1.8 1.7 - 2.4 mg/dL    Comment: Performed at [facility], [address] [place], [place] [address]  Troponin I (High Sensitivity)     Status: None   Collection Time: 08/10/22 12:16 PM  Result Value Ref Range   Troponin I (High Sensitivity) 7 <18 ng/L    Comment: (NOTE) Elevated high sensitivity troponin I (hsTnI) values and significant  changes across serial  measurements may suggest ACS but many other  chronic and acute conditions are known to elevate hsTnI results.  Refer to the "Links" section for chest pain algorithms and additional  guidance. Performed at [facility], [address] [place], [place] [address]  MM 3D SCREENING MAMMOGRAM BILATERAL BREAST  Result Date: 08/10/2022 CLINICAL DATA:  Screening. EXAM: DIGITAL SCREENING BILATERAL MAMMOGRAM WITH TOMOSYNTHESIS AND CAD TECHNIQUE: Bilateral screening digital craniocaudal and mediolateral oblique mammograms were obtained. Bilateral screening digital breast tomosynthesis was performed. The images were evaluated with computer-aided detection. COMPARISON:  Previous exam(s). ACR Breast Density Category b: There are scattered areas of fibroglandular density. FINDINGS: There are no findings suspicious for malignancy. IMPRESSION: No mammographic evidence of malignancy. A result letter of this screening mammogram will be mailed directly to the patient. RECOMMENDATION: Screening mammogram in one year. (Code:SM-B-01Y) BI-RADS CATEGORY  1: Negative. Electronically Signed   By: Abelardo Diesel M.D.   On: 08/10/2022 12:27   DG Chest 2 View  Result Date: 08/10/2022 CLINICAL DATA:  Chest pain EXAM: CHEST - 2 VIEW COMPARISON:  August 16, 2021 FINDINGS: The cardiomediastinal silhouette is unchanged in contour. No focal pulmonary opacity. No pleural effusion or pneumothorax. The visualized upper abdomen is unremarkable. No acute osseous abnormality. IMPRESSION: No active cardiopulmonary disease. Electronically Signed   By: Beryle Flock M.D.   On: 08/10/2022 10:16   US PELVIC COMPLETE WITH TRANSVAGINAL  Result Date: 08/09/2022 CLINICAL DATA:  postmenopausal bleeding EXAM: ULTRASOUND OF PELVIS TECHNIQUE: Transabdominal and transvaginalultrasound examination of the pelvis was performed including evaluation of the uterus, ovaries, adnexal regions, and pelvic cul-de-sac. COMPARISON:  12/17/2011. FINDINGS:  Uterusanteverted, 9 x 7 x 6 cm. The endometrium thickened, 0.7 cm. The uterine cavity is empty. Uterine fibroids identified on the right, 3.9 x 3.0 x 2.6 cm and posterior subserosal 1.7 x 1.3 x 1.3 cm. Right ovary Unremarkable, 2.7 x 2.2 x 2.2 cm. Left ovary Dominant simple follicular cyst 2.9 cm. This need not be followed further. Ovary measurements 3.7 x 2.9 x 2.7 cm. Images of the adnexae demonstrated no masses or fluid collections IMPRESSION: 1. Thickened endometrium. 2. Fibroids. Electronically Signed   By: Sammie Bench M.D.   On: 08/09/2022 14:11    Pending Labs Unresulted Labs (From admission, onward)    None       Vitals/Pain Today's Vitals   08/10/22 1442 08/10/22 1445 08/10/22 1500 08/10/22 1530  BP:  (!) 155/92 (!) 170/103 (!) 154/103  Pulse:  71 65 69  Resp: 18 19 16 15   Temp: 98.2 F (36.8 C)   98.2 F (36.8 C)  TempSrc: Oral   Oral  SpO2:  98% 99% 97%  Weight:      Height:      PainSc:        Isolation Precautions No active isolations  Medications Medications - No data to display  Mobility walks     Focused Assessments Cardiac Assessment Handoff:    No results found for: "CKTOTAL", "CKMB", "CKMBINDEX", "TROPONINI" Lab Results  Component Value Date   DDIMER <0.27 08/10/2022   Does the Patient currently have chest pain? No    R Recommendations: See Admitting Provider Note  Report given to:   Additional Notes:

## 2022-08-10 NOTE — Assessment & Plan Note (Addendum)
-  uncontrolled. Questions compliance.  -continue amlodipine and Losartan -started on Imdur -PRN hydralazine for SBP >160 -If BP remains uncontrol, could increase amlodipine to max dose or add in beta-blocker

## 2022-08-10 NOTE — ED Triage Notes (Addendum)
Patient presents to ED via POV from home. Here with intermittent chest pain x 1 week. Reports pain is on the left side of her chest but radiates into her back, where her shoulder blade is. Denies nausea or vomiting. Reports shortness of breath when chest pain is present. History of cardiac stent.

## 2022-08-11 ENCOUNTER — Observation Stay (HOSPITAL_BASED_OUTPATIENT_CLINIC_OR_DEPARTMENT_OTHER): Payer: 59

## 2022-08-11 ENCOUNTER — Encounter (HOSPITAL_COMMUNITY)
Admission: EM | Disposition: A | Discharge status: Home / Self Care | Payer: Self-pay | Source: Home / Self Care | Attending: Emergency Medicine

## 2022-08-11 DIAGNOSIS — I25119 Atherosclerotic heart disease of native coronary artery with unspecified angina pectoris: Secondary | ICD-10-CM

## 2022-08-11 DIAGNOSIS — F17200 Nicotine dependence, unspecified, uncomplicated: Secondary | ICD-10-CM | POA: Diagnosis not present

## 2022-08-11 DIAGNOSIS — I10 Essential (primary) hypertension: Secondary | ICD-10-CM | POA: Diagnosis not present

## 2022-08-11 DIAGNOSIS — R072 Precordial pain: Secondary | ICD-10-CM

## 2022-08-11 DIAGNOSIS — E785 Hyperlipidemia, unspecified: Secondary | ICD-10-CM | POA: Diagnosis not present

## 2022-08-11 DIAGNOSIS — R079 Chest pain, unspecified: Secondary | ICD-10-CM | POA: Diagnosis not present

## 2022-08-11 DIAGNOSIS — R0789 Other chest pain: Secondary | ICD-10-CM | POA: Diagnosis not present

## 2022-08-11 HISTORY — PX: LEFT HEART CATH AND CORONARY ANGIOGRAPHY: CATH118249

## 2022-08-11 LAB — ECHOCARDIOGRAM COMPLETE
AR max vel: 2.51 cm2
AV Area VTI: 3.11 cm2
AV Area mean vel: 2.56 cm2
AV Mean grad: 3 mmHg
AV Peak grad: 6.7 mmHg
Ao pk vel: 1.29 m/s
Area-P 1/2: 3.93 cm2
Est EF: 50
Height: 64 in
MV M vel: 5.21 m/s
MV Peak grad: 108.6 mmHg
S' Lateral: 3.8 cm
Single Plane A4C EF: 22.7 %
Weight: 3616 oz

## 2022-08-11 SURGERY — LEFT HEART CATH AND CORONARY ANGIOGRAPHY
Anesthesia: LOCAL

## 2022-08-11 MED ORDER — SODIUM CHLORIDE 0.9 % IV SOLN: 250.0000 mL | INTRAVENOUS | Status: DC | PRN

## 2022-08-11 MED ORDER — HEPARIN SODIUM (PORCINE) 1000 UNIT/ML IJ SOLN
INTRAMUSCULAR | Status: DC | PRN
  Administered 2022-08-11: 5000 [IU] via INTRAVENOUS

## 2022-08-11 MED ORDER — DIPHENHYDRAMINE HCL 50 MG/ML IJ SOLN
25.0000 mg | Freq: Once | INTRAMUSCULAR | Status: AC
  Administered 2022-08-11: 25 mg via INTRAVENOUS
  Filled 2022-08-11: qty 1

## 2022-08-11 MED ORDER — IOHEXOL 350 MG/ML SOLN
INTRAVENOUS | Status: DC | PRN
  Administered 2022-08-11: 65 mL via INTRA_ARTERIAL

## 2022-08-11 MED ORDER — FENTANYL CITRATE (PF) 100 MCG/2ML IJ SOLN
INTRAMUSCULAR | Status: AC
  Filled 2022-08-11: qty 2

## 2022-08-11 MED ORDER — SODIUM CHLORIDE 0.9 % IV SOLN: INTRAVENOUS | Status: AC

## 2022-08-11 MED ORDER — MORPHINE SULFATE (PF) 2 MG/ML IV SOLN
1.0000 mg | Freq: Once | INTRAVENOUS | Status: AC
  Administered 2022-08-11: 1 mg via INTRAVENOUS
  Filled 2022-08-11: qty 1

## 2022-08-11 MED ORDER — SODIUM CHLORIDE 0.9 % WEIGHT BASED INFUSION
3.0000 mL/kg/h | INTRAVENOUS | Status: DC
  Administered 2022-08-11: 3 mL/kg/h via INTRAVENOUS

## 2022-08-11 MED ORDER — SODIUM CHLORIDE 0.9 % WEIGHT BASED INFUSION
1.0000 mL/kg/h | INTRAVENOUS | Status: DC
  Administered 2022-08-11: 1 mL/kg/h via INTRAVENOUS

## 2022-08-11 MED ORDER — SODIUM CHLORIDE 0.9% FLUSH: 3.0000 mL | Freq: Two times a day (BID) | INTRAVENOUS | Status: DC

## 2022-08-11 MED ORDER — SODIUM CHLORIDE 0.9% FLUSH: 3.0000 mL | INTRAVENOUS | Status: DC | PRN

## 2022-08-11 MED ORDER — LIDOCAINE HCL (PF) 1 % IJ SOLN
INTRAMUSCULAR | Status: DC | PRN
  Administered 2022-08-11: 5 mL

## 2022-08-11 MED ORDER — MIDAZOLAM HCL 2 MG/2ML IJ SOLN
INTRAMUSCULAR | Status: DC | PRN
  Administered 2022-08-11: 2 mg via INTRAVENOUS

## 2022-08-11 MED ORDER — MIDAZOLAM HCL 2 MG/2ML IJ SOLN
INTRAMUSCULAR | Status: AC
  Filled 2022-08-11: qty 2

## 2022-08-11 MED ORDER — HEPARIN SODIUM (PORCINE) 1000 UNIT/ML IJ SOLN
INTRAMUSCULAR | Status: AC
  Filled 2022-08-11: qty 10

## 2022-08-11 MED ORDER — LIDOCAINE HCL (PF) 1 % IJ SOLN
INTRAMUSCULAR | Status: AC
  Filled 2022-08-11: qty 30

## 2022-08-11 MED ORDER — LABETALOL HCL 5 MG/ML IV SOLN: 10.0000 mg | INTRAVENOUS | Status: AC | PRN

## 2022-08-11 MED ORDER — METHYLPREDNISOLONE SODIUM SUCC 125 MG IJ SOLR
125.0000 mg | Freq: Once | INTRAMUSCULAR | Status: AC
  Administered 2022-08-11: 125 mg via INTRAVENOUS
  Filled 2022-08-11: qty 2

## 2022-08-11 MED ORDER — FENTANYL CITRATE (PF) 100 MCG/2ML IJ SOLN
INTRAMUSCULAR | Status: DC | PRN
  Administered 2022-08-11: 25 ug via INTRAVENOUS

## 2022-08-11 MED ORDER — VERAPAMIL HCL 2.5 MG/ML IV SOLN
INTRAVENOUS | Status: AC
  Filled 2022-08-11: qty 2

## 2022-08-11 MED ORDER — HEPARIN (PORCINE) IN NACL 1000-0.9 UT/500ML-% IV SOLN
INTRAVENOUS | Status: DC | PRN
  Administered 2022-08-11 (×2): 500 mL

## 2022-08-11 MED ORDER — VERAPAMIL HCL 2.5 MG/ML IV SOLN
INTRAVENOUS | Status: DC | PRN
  Administered 2022-08-11: 10 mL via INTRA_ARTERIAL

## 2022-08-11 SURGICAL SUPPLY — 9 items
CATH 5FR JL3.5 JR4 ANG PIG MP (CATHETERS) IMPLANT
DEVICE RAD COMP TR BAND LRG (VASCULAR PRODUCTS) IMPLANT
GLIDESHEATH SLEND SS 6F .021 (SHEATH) IMPLANT
GUIDEWIRE INQWIRE 1.5J.035X260 (WIRE) IMPLANT
INQWIRE 1.5J .035X260CM (WIRE) ×1
KIT HEART LEFT (KITS) ×1 IMPLANT
PACK CARDIAC CATHETERIZATION (CUSTOM PROCEDURE TRAY) ×1 IMPLANT
TRANSDUCER W/STOPCOCK (MISCELLANEOUS) ×1 IMPLANT
TUBING CIL FLEX 10 FLL-RA (TUBING) ×1 IMPLANT

## 2022-08-11 NOTE — Interval H&P Note (Signed)
History and Physical Interval Note:  08/11/2022 3:43 PM  Amy Wade  has presented today for surgery, with the diagnosis of chest pain.  The various methods of treatment have been discussed with the patient and family. After consideration of risks, benefits and other options for treatment, the patient has consented to  Procedure(s): LEFT HEART CATH AND CORONARY ANGIOGRAPHY (N/A) as a surgical intervention.  The patient's history has been reviewed, patient examined, no change in status, stable for surgery.  I have reviewed the patient's chart and labs.  Questions were answered to the patient's satisfaction.     Sherren Mocha

## 2022-08-11 NOTE — Care Management (Signed)
  Transition of Care [facility]) Screening Note   Patient Details  Name: Amy Wade Date of Birth: [birth year]   Transition of [facility] CM/SW Contact:    Bethena Roys, RN Phone Number: 08/11/2022, 1:12 PM    Transition of Care Department [facility]) has reviewed the patient and no TOC needs have been identified at this time. Patient presented for chest pain-plan for LHC today. Patient has insurance and PCP. We will continue to monitor patient advancement through interdisciplinary progression rounds. If new patient transition needs arise, please place a TOC consult.

## 2022-08-11 NOTE — H&P (View-Only) (Signed)
Cardiology Consultation   Patient ID: Amy Wade MRN: [id number]; DOB: [birth year]  Admit date: 08/10/2022 Date of Consult: 08/11/2022  PCP:  Marrian Salvage, [facility] Cardiologist:  None   {  Patient Profile:   Amy Wade is a 52 y.o. female with a hx of CAD s/p PCI RCA 2011, HTN, hyperlipidemia, who is being seen 08/11/2022 for the evaluation of chest pain at the request of Dr. Karleen Hampshire.  History of Present Illness:   [name] has cardiac history significant for coronary artery disease status post PCI to the RCA in 2012 at [facility] with subsequent cardiac catheterization in 2016 per chart review; however unable to view either of these results directly.  She has risk factors including: smoker (smoke free 6 days), hypertension, family history, hyperlipidemia. Prior notes indicated that heart catheterization in 2016 showed mild, diffuse, nonobstructing disease.  She was seen in office by Dr. Audie Box on 07/26/2022 with ongoing complaints of chest pressure, primarily on the left side occurring during rest and not worsened with exertion.  Dr. Nori Riis at that time suspected it to be musculoskeletal or related to blood pressure.  He discussed plan for PET stress test and echocardiogram for further evaluation.  She was prescribed sublingual nitroglycerin in addition to losartan 50 mg and amlodipine 5 mg.  EKG during an office noted inferolateral T wave inversions that appeared new. He related this to her uncontrolled hypertension.  Now patient presenting to the ED on 07/10/2022 with ongoing complaint of atypical chest pain that woke her up sleep last night.  She states that she has had ongoing tolerable chest pain for years however has worsened in the last month. She states this pain happens at rest and and always occurs left side with occasionally associated shortness of breath, nausea/vomiting.  She has been taking nitroglycerin up to 4 times daily with  immediate relief.  Pain is not associated with food. Often lasts 20-30 min. States that this pain feels similar to her MI in 2011.  ED workup has been reassuring for no acute cardiac event.  She had flat troponins at 8 and 7.  EKG noting inferior lateral T wave inversions same as prior outpatient EKG.  Chest x-ray negative.  Denies any heart palpitations, peripheral edema, orthopnea, or dizziness.  Past Medical History:  Diagnosis Date   Coronary artery disease    Hyperlipidemia    Hypertension    Myocardial infarction    Vaginal Pap smear, abnormal     Past Surgical History:  Procedure Laterality Date   CARDIAC CATHETERIZATION     ECTOPIC PREGNANCY SURGERY       Inpatient Medications: Scheduled Meds:  amLODipine  5 mg Oral Daily   aspirin EC  81 mg Oral Daily   enoxaparin (LOVENOX) injection  40 mg Subcutaneous Q24H   isosorbide mononitrate  30 mg Oral Daily   losartan  50 mg Oral Daily   nicotine  14 mg Transdermal Q2200   pneumococcal 20-valent conjugate vaccine  0.5 mL Intramuscular Tomorrow-1000   rosuvastatin  20 mg Oral Daily   Continuous Infusions:  PRN Meds: acetaminophen, hydrALAZINE, ondansetron (ZOFRAN) IV  Allergies:    Allergies  Allergen Reactions   Ivp Dye [Iodinated Contrast Media] Hives    Social History:   Social History   Socioeconomic History   Marital status: Single    Spouse name: Not on file   Number of children: 2   Years of education: Not on file  Highest education level: GED or equivalent  Occupational History   Not on file  Tobacco Use   Smoking status: Every Day    Packs/day: 0.50    Years: 20.00    Additional pack years: 0.00    Total pack years: 10.00    Types: Cigarettes   Smokeless tobacco: Not on file  Vaping Use   Vaping Use: Never used  Substance and Sexual Activity   Alcohol use: Yes   Drug use: No   Sexual activity: Yes    Birth control/protection: None  Other Topics Concern   Not on file  Social History  Narrative   Not on file   Social Determinants of Health   Financial Resource Strain: Low Risk  (08/10/2022)   Overall Financial Resource Strain (CARDIA)    Difficulty of Paying Living Expenses: Not very hard  Food Insecurity: No Food Insecurity (08/10/2022)   Hunger Vital Sign    Worried About Running Out of Food in the Last Year: Never true    Ran Out of Food in the Last Year: Never true  Transportation Needs: No Transportation Needs (08/10/2022)   PRAPARE - Hydrologist (Medical): No    Lack of Transportation (Non-Medical): No  Physical Activity: Unknown (08/10/2022)   Exercise Vital Sign    Days of Exercise per Week: 0 days    Minutes of Exercise per Session: Not on file  Stress: Stress Concern Present (08/10/2022)   [place]    Feeling of Stress : Very much  Social Connections: Moderately Isolated (08/10/2022)   Social Connection and Isolation Panel [NHANES]    Frequency of Communication with Friends and Family: Twice a week    Frequency of Social Gatherings with Friends and Family: Once a week    Attends Religious Services: Never    Marine scientist or Organizations: No    Attends Music therapist: Not on file    Marital Status: Living with partner  Intimate Partner Violence: Not At Risk (08/10/2022)   Humiliation, Afraid, Rape, and Kick questionnaire    Fear of Current or Ex-Partner: No    Emotionally Abused: No    Physically Abused: No    Sexually Abused: No    Family History:   Family History  Problem Relation Age of Onset   Heart disease Father      ROS:  Please see the history of present illness.  All other ROS reviewed and negative.     Physical Exam/Data:   Vitals:   08/11/22 0057 08/11/22 0348 08/11/22 0406 08/11/22 0752  BP: (!) 140/89 (!) 160/91 (!) 167/87 (!) 142/89  Pulse: 79 79 82 84  Resp: 18 18  15   Temp: 97.8 F (36.6 C) 98.1 F (36.7 C)   98.2 F (36.8 C)  TempSrc: Oral Oral  Oral  SpO2: 98% 98% 96% 99%  Weight:      Height:       No intake or output data in the 24 hours ending 08/11/22 0854    08/10/2022    9:30 AM 08/10/2022    8:50 AM 08/04/2022    8:38 AM  Last 3 Weights  Weight (lbs) 226 lb 224 lb 223 lb  Weight (kg) 102.513 kg 101.606 kg 101.152 kg     Body mass index is 38.79 kg/m.  General:  Well nourished, well developed, in no acute distress HEENT: normal Neck: no JVD Vascular: No carotid bruits;  Distal pulses 2+ bilaterally Cardiac:  normal S1, S2; RRR; no murmur  Lungs:  mild wheezing  Abd: soft, nontender, no hepatomegaly  Ext: no edema Musculoskeletal:  No deformities, BUE and BLE strength normal and equal Skin: warm and dry  Neuro:  CNs 2-12 intact, no focal abnormalities noted Psych:  Normal affect   EKG:  The EKG was personally reviewed and demonstrates:  Normal sinus rhythm with a heart rate of 70.  Borderline low voltage.  T wave inversions in inferolateral leads same as prior study 07/26/2022 Telemetry:  Telemetry was personally reviewed and demonstrates: Normal sinus rhythm  Relevant CV Studies: Echocardiogram pending  Laboratory Data:  High Sensitivity Troponin:   Recent Labs  Lab 08/10/22 1020 08/10/22 1216  TROPONINIHS 8 7     Chemistry Recent Labs  Lab 08/10/22 1020  NA 136  K 3.4*  CL 103  CO2 26  GLUCOSE 99  BUN 8  CREATININE 0.75  CALCIUM 8.4*  MG 1.8  GFRNONAA >60  ANIONGAP 7    Recent Labs  Lab 08/10/22 1020  PROT 6.7  ALBUMIN 3.7  AST 15  ALT 12  ALKPHOS 94  BILITOT 0.3   Lipids No results for input(s): "CHOL", "TRIG", "HDL", "LABVLDL", "LDLCALC", "CHOLHDL" in the last 168 hours.  Hematology Recent Labs  Lab 08/10/22 1020  WBC 9.6  RBC 5.09  HGB 14.0  HCT 42.4  MCV 83.3  MCH 27.5  MCHC 33.0  RDW 14.5  PLT 452*   Thyroid No results for input(s): "TSH", "FREET4" in the last 168 hours.  BNPNo results for input(s): "BNP", "PROBNP" in the  last 168 hours.  DDimer  Recent Labs  Lab 08/10/22 1020  DDIMER <0.27     Radiology/Studies:  MM 3D SCREENING MAMMOGRAM BILATERAL BREAST  Result Date: 08/10/2022 CLINICAL DATA:  Screening. EXAM: DIGITAL SCREENING BILATERAL MAMMOGRAM WITH TOMOSYNTHESIS AND CAD TECHNIQUE: Bilateral screening digital craniocaudal and mediolateral oblique mammograms were obtained. Bilateral screening digital breast tomosynthesis was performed. The images were evaluated with computer-aided detection. COMPARISON:  Previous exam(s). ACR Breast Density Category b: There are scattered areas of fibroglandular density. FINDINGS: There are no findings suspicious for malignancy. IMPRESSION: No mammographic evidence of malignancy. A result letter of this screening mammogram will be mailed directly to the patient. RECOMMENDATION: Screening mammogram in one year. (Code:SM-B-01Y) BI-RADS CATEGORY  1: Negative. Electronically Signed   By: Abelardo Diesel M.D.   On: 08/10/2022 12:27   DG Chest 2 View  Result Date: 08/10/2022 CLINICAL DATA:  Chest pain EXAM: CHEST - 2 VIEW COMPARISON:  August 16, 2021 FINDINGS: The cardiomediastinal silhouette is unchanged in contour. No focal pulmonary opacity. No pleural effusion or pneumothorax. The visualized upper abdomen is unremarkable. No acute osseous abnormality. IMPRESSION: No active cardiopulmonary disease. Electronically Signed   By: Beryle Flock M.D.   On: 08/10/2022 10:16   US PELVIC COMPLETE WITH TRANSVAGINAL  Result Date: 08/09/2022 CLINICAL DATA:  postmenopausal bleeding EXAM: ULTRASOUND OF PELVIS TECHNIQUE: Transabdominal and transvaginalultrasound examination of the pelvis was performed including evaluation of the uterus, ovaries, adnexal regions, and pelvic cul-de-sac. COMPARISON:  12/17/2011. FINDINGS: Uterusanteverted, 9 x 7 x 6 cm. The endometrium thickened, 0.7 cm. The uterine cavity is empty. Uterine fibroids identified on the right, 3.9 x 3.0 x 2.6 cm and posterior  subserosal 1.7 x 1.3 x 1.3 cm. Right ovary Unremarkable, 2.7 x 2.2 x 2.2 cm. Left ovary Dominant simple follicular cyst 2.9 cm. This need not be followed further. Ovary measurements 3.7 x  2.9 x 2.7 cm. Images of the adnexae demonstrated no masses or fluid collections IMPRESSION: 1. Thickened endometrium. 2. Fibroids. Electronically Signed   By: Sammie Bench M.D.   On: 08/09/2022 14:11     Assessment and Plan:   Chest pain with history of CAD status post PCI to the RCA in 2011 Patient with multiple risk factors with complaints of ongoing complaints of chest pain worsened in the past 1 month associated with nausea/vomiting, shortness of breath and relieved with nitroglycerin.  She was evaluated outpatient by Dr. Audie Box with plans to undergo PET stress testing and echocardiogram.  Troponins have been negative and flat.  EKG noting inferolateral T wave inversions without ST-T wave elevation. Continue aspirin 81 mg, Imdur 30 mg, rosuvastatin 20 mg daily Will discuss with MD about plans for outpatient PET stress testing or possible cardiac catheterization (has contrast dye allergy) Echo pending  Hypertension Continue amlodipine 5 mg daily, losartan daily  Hyperlipidemia Continue rosuvastatin 20 mg. LDL 144. Rosuvastatin dose has just been increased from 10 to 20. Can evaluate again in 3-6 months.  Tobacco use Has stopped for the past 6 days. Currently has nicotine patch.  OSA Patient noting apneic spells while sleeping.  Has elevated Mallampati score.  We discussed about CPAP machine and outpatient referral.  Patient agreeable.  Risk Assessment/Risk Scores:   TIMI Risk Score for Unstable Angina or Non-ST Elevation MI:   The patient's TIMI risk score is 4, which indicates a 20% risk of all cause mortality, new or recurrent myocardial infarction or need for urgent revascularization in the next 14 days.    For questions or updates, please contact [place] Please consult  [email or web address] for contact info under    Signed, Bonnee Quin, PA-C  08/11/2022 8:54 AM As above, patient seen and examined.  Briefly she is a 52 year old female with past medical history of coronary artery disease, hypertension, hyperlipidemia for evaluation of chest pain.  Patient has had previous myocardial infarction and PCI of the right coronary artery in 2011.  She was seen recently by Dr. Davina Poke with complaints of chest pain and outpatient PET stress was ordered as well as echocardiogram.  These have not been performed.  However she has continued to have chest pain which is worsening in severity.  It is in the left chest area described as a pressure radiating to her left upper extremity.  It is relieved with nitroglycerin.  It is not related to activities and typically last 5 to 20 minutes.  There is associated shortness of breath but no nausea or diaphoresis.  She otherwise denies dyspnea on exertion or syncope.  She was admitted due to worsening chest pain and cardiology asked to evaluate.  Electrocardiogram shows sinus rhythm with inferior infarct and anterior infarct cannot be excluded; inferolateral T wave inversion unchanged compared to March.  Troponins are normal.  Creatinine 0.75, hemoglobin 14.  1 chest pain-symptoms with both typical and atypical features.  However they are progressive and recurring.  I would favor definitive evaluation.  The risk and benefits of cardiac catheterization including myocardial infarction, CVA and death discussed and she agrees to proceed.  She does have a dye allergy and will need to be premedicated with prednisone and Benadryl.  Continue aspirin and statin.  Will also arrange echocardiogram to assess LV function.  2 tobacco abuse-patient counseled on discontinuing.  She has not smoked in the past 6 days by her report.  3 hypertension-will continue preadmission blood pressure medications and  advance as needed.  4 hyperlipidemia-given history of documented  coronary disease will increase Crestor to 40 mg daily.  Check lipids and liver in 8 weeks.  Kirk Ruths, MD

## 2022-08-11 NOTE — Progress Notes (Signed)
[facility]                                                                               Amy Wade, is a 52 y.o. female, DOB - Oct 03, 1970, [id number] Admit date - 08/10/2022    Outpatient Primary MD for the patient is Amy Dross Marvis Repress, FNP  LOS - 0  days    Brief summary   Amy Wade is a 52 y.o. female with medical history significant of HTN, HLD, CAD s/p PCI who presents as a transfer for chest pain.    Assessment & Plan    Assessment and Plan: * Chest pain -hx of CAD s/p PCI RCA in 2012 with reported [employer] in 2016 which showed no evidence of new coronary lesions and widely patent right coronary artery stent.  -Troponin reassuring and flat at 8-7. EKG  shows inferior lateral T wave inversion. -chest pain ongoing for at least a month.  - echocardiogram ordered.  - cardiology consulted, plan for cardiac cath.  Continue with aspirin 81 mg dialy.   Obesity (BMI 30-39.9) BMI of 38  Hypocalcemia -replete with oral calcium  Hypokalemia Replaced.   Hyperlipidemia -continue statin. Crestor 20 mg daily.  CHECK liver panel in 8 weeks.   Tobacco use disorder -reports quitting 4 days ago but was smoking a pack/3 days -nicotine patch ordered -encourage continual cessation  Essential hypertension Suboptimally controlled.  ? Non compliance Currently on amlodipine 5 mg daily, imdur 30 mg daily, losartan 50 mg daily.      Estimated body mass index is 38.79 kg/m as calculated from the following:   Height as of this encounter: 5\' 4"  (1.626 m).   Weight as of this encounter: 102.5 kg.  Code Status: full code.  DVT Prophylaxis:  enoxaparin (LOVENOX) injection 40 mg Start: 08/10/22 2100   Level of Care: Level of care: Telemetry Cardiac Family Communication: none at bedside.   Disposition Plan:     Remains inpatient appropriate:  cardiac cath.   Procedures:  Cardiac cath   Consultants:   Cardiology.   Antimicrobials:    Anti-infectives (From admission, onward)    None        Medications  Scheduled Meds:  amLODipine  5 mg Oral Daily   aspirin EC  81 mg Oral Daily   diphenhydrAMINE  25 mg Intravenous Once   enoxaparin (LOVENOX) injection  40 mg Subcutaneous Q24H   isosorbide mononitrate  30 mg Oral Daily   losartan  50 mg Oral Daily   methylPREDNISolone sodium succinate  125 mg Intravenous Once   nicotine  14 mg Transdermal Q2200   pneumococcal 20-valent conjugate vaccine  0.5 mL Intramuscular Tomorrow-1000   rosuvastatin  20 mg Oral Daily   Continuous Infusions:  sodium chloride     Followed by   sodium chloride     PRN Meds:.acetaminophen, hydrALAZINE, ondansetron (ZOFRAN) IV    Subjective:   Amy Wade was seen and examined today.  No chest pain this am.   Objective:   Vitals:   08/11/22 0406 08/11/22 0752 08/11/22 0931 08/11/22 1242  BP: (!) 167/87 (!) 142/89 (!) 164/94 (!) 137/91  Pulse: 82 84 83 92  Resp:  15 16 16   Temp:  98.2 F (36.8 C)  98 F (36.7 C)  TempSrc:  Oral  Oral  SpO2: 96% 99% 98% 99%  Weight:      Height:       No intake or output data in the 24 hours ending 08/11/22 1409 Filed Weights   08/10/22 0930  Weight: 102.5 kg     Exam General: Alert and oriented x 3, NAD Cardiovascular: S1 S2 auscultated, no murmurs, RRR Respiratory: Clear to auscultation bilaterally, no wheezing, rales or rhonchi Gastrointestinal: Soft, nontender, nondistended, + bowel sounds Ext: no pedal edema bilaterally Neuro: AAOx3, Cr N's II- XII. Strength 5/5 upper and lower extremities bilaterally Skin: No rashes Psych: Normal affect and demeanor, alert and oriented x3    Data Reviewed:  I have personally reviewed following labs and imaging studies   CBC Lab Results  Component Value Date   WBC 9.6 08/10/2022   RBC 5.09 08/10/2022   HGB 14.0 08/10/2022   HCT 42.4 08/10/2022   MCV 83.3 08/10/2022   MCH 27.5 08/10/2022   PLT 452 (H) 08/10/2022   MCHC 33.0  08/10/2022   RDW 14.5 08/10/2022   LYMPHSABS 3.5 08/10/2022   MONOABS 0.7 08/10/2022   EOSABS 0.3 08/10/2022   BASOSABS 0.0 0000000     Last metabolic panel Lab Results  Component Value Date   NA 136 08/10/2022   K 3.4 (L) 08/10/2022   CL 103 08/10/2022   CO2 26 08/10/2022   BUN 8 08/10/2022   CREATININE 0.75 08/10/2022   GLUCOSE 99 08/10/2022   GFRNONAA >60 08/10/2022   CALCIUM 8.4 (L) 08/10/2022   PROT 6.7 08/10/2022   ALBUMIN 3.7 08/10/2022   BILITOT 0.3 08/10/2022   ALKPHOS 94 08/10/2022   AST 15 08/10/2022   ALT 12 08/10/2022   ANIONGAP 7 08/10/2022    CBG (last 3)  No results for input(s): "GLUCAP" in the last 72 hours.    Coagulation Profile: No results for input(s): "INR", "PROTIME" in the last 168 hours.   Radiology Studies: DG Chest 2 View  Result Date: 08/10/2022 CLINICAL DATA:  Chest pain EXAM: CHEST - 2 VIEW COMPARISON:  August 16, 2021 FINDINGS: The cardiomediastinal silhouette is unchanged in contour. No focal pulmonary opacity. No pleural effusion or pneumothorax. The visualized upper abdomen is unremarkable. No acute osseous abnormality. IMPRESSION: No active cardiopulmonary disease. Electronically Signed   By: Beryle Flock M.D.   On: 08/10/2022 10:16       Hosie Poisson M.D. [facility] 08/11/2022, 2:09 PM  Available via Epic secure chat 7am-7pm After 7 pm, please refer to night coverage provider listed on amion.

## 2022-08-11 NOTE — Consult Note (Signed)
Cardiology Consultation   Patient ID: Amy Wade MRN: [id number]; DOB: Jul 24, 1970  Admit date: 08/10/2022 Date of Consult: 08/11/2022  PCP:  Marrian Salvage, [facility] Cardiologist:  None   {  Patient Profile:   Amy Wade is a 52 y.o. female with a hx of CAD s/p PCI RCA 2011, HTN, hyperlipidemia, who is being seen 08/11/2022 for the evaluation of chest pain at the request of Dr. Karleen Hampshire.  History of Present Illness:   [name] has cardiac history significant for coronary artery disease status post PCI to the RCA in 2012 at [facility] with subsequent cardiac catheterization in 2016 per chart review; however unable to view either of these results directly.  She has risk factors including: smoker (smoke free 6 days), hypertension, family history, hyperlipidemia. Prior notes indicated that heart catheterization in 2016 showed mild, diffuse, nonobstructing disease.  She was seen in office by Dr. Audie Box on 07/26/2022 with ongoing complaints of chest pressure, primarily on the left side occurring during rest and not worsened with exertion.  Dr. Nori Riis at that time suspected it to be musculoskeletal or related to blood pressure.  He discussed plan for PET stress test and echocardiogram for further evaluation.  She was prescribed sublingual nitroglycerin in addition to losartan 50 mg and amlodipine 5 mg.  EKG during an office noted inferolateral T wave inversions that appeared new. He related this to her uncontrolled hypertension.  Now patient presenting to the ED on 07/10/2022 with ongoing complaint of atypical chest pain that woke her up sleep last night.  She states that she has had ongoing tolerable chest pain for years however has worsened in the last month. She states this pain happens at rest and and always occurs left side with occasionally associated shortness of breath, nausea/vomiting.  She has been taking nitroglycerin up to 4 times daily with  immediate relief.  Pain is not associated with food. Often lasts 20-30 min. States that this pain feels similar to her MI in 2011.  ED workup has been reassuring for no acute cardiac event.  She had flat troponins at 8 and 7.  EKG noting inferior lateral T wave inversions same as prior outpatient EKG.  Chest x-ray negative.  Denies any heart palpitations, peripheral edema, orthopnea, or dizziness.  Past Medical History:  Diagnosis Date   Coronary artery disease    Hyperlipidemia    Hypertension    Myocardial infarction    Vaginal Pap smear, abnormal     Past Surgical History:  Procedure Laterality Date   CARDIAC CATHETERIZATION     ECTOPIC PREGNANCY SURGERY       Inpatient Medications: Scheduled Meds:  amLODipine  5 mg Oral Daily   aspirin EC  81 mg Oral Daily   enoxaparin (LOVENOX) injection  40 mg Subcutaneous Q24H   isosorbide mononitrate  30 mg Oral Daily   losartan  50 mg Oral Daily   nicotine  14 mg Transdermal Q2200   pneumococcal 20-valent conjugate vaccine  0.5 mL Intramuscular Tomorrow-1000   rosuvastatin  20 mg Oral Daily   Continuous Infusions:  PRN Meds: acetaminophen, hydrALAZINE, ondansetron (ZOFRAN) IV  Allergies:    Allergies  Allergen Reactions   Ivp Dye [Iodinated Contrast Media] Hives    Social History:   Social History   Socioeconomic History   Marital status: Single    Spouse name: Not on file   Number of children: 2   Years of education: Not on file  Highest education level: GED or equivalent  Occupational History   Not on file  Tobacco Use   Smoking status: Every Day    Packs/day: 0.50    Years: 20.00    Additional pack years: 0.00    Total pack years: 10.00    Types: Cigarettes   Smokeless tobacco: Not on file  Vaping Use   Vaping Use: Never used  Substance and Sexual Activity   Alcohol use: Yes   Drug use: No   Sexual activity: Yes    Birth control/protection: None  Other Topics Concern   Not on file  Social History  Narrative   Not on file   Social Determinants of Health   Financial Resource Strain: Low Risk  (08/10/2022)   Overall Financial Resource Strain (CARDIA)    Difficulty of Paying Living Expenses: Not very hard  Food Insecurity: No Food Insecurity (08/10/2022)   Hunger Vital Sign    Worried About Running Out of Food in the Last Year: Never true    Ran Out of Food in the Last Year: Never true  Transportation Needs: No Transportation Needs (08/10/2022)   PRAPARE - Hydrologist (Medical): No    Lack of Transportation (Non-Medical): No  Physical Activity: Unknown (08/10/2022)   Exercise Vital Sign    Days of Exercise per Week: 0 days    Minutes of Exercise per Session: Not on file  Stress: Stress Concern Present (08/10/2022)   [place]    Feeling of Stress : Very much  Social Connections: Moderately Isolated (08/10/2022)   Social Connection and Isolation Panel [NHANES]    Frequency of Communication with Friends and Family: Twice a week    Frequency of Social Gatherings with Friends and Family: Once a week    Attends Religious Services: Never    Marine scientist or Organizations: No    Attends Music therapist: Not on file    Marital Status: Living with partner  Intimate Partner Violence: Not At Risk (08/10/2022)   Humiliation, Afraid, Rape, and Kick questionnaire    Fear of Current or Ex-Partner: No    Emotionally Abused: No    Physically Abused: No    Sexually Abused: No    Family History:   Family History  Problem Relation Age of Onset   Heart disease Father      ROS:  Please see the history of present illness.  All other ROS reviewed and negative.     Physical Exam/Data:   Vitals:   08/11/22 0057 08/11/22 0348 08/11/22 0406 08/11/22 0752  BP: (!) 140/89 (!) 160/91 (!) 167/87 (!) 142/89  Pulse: 79 79 82 84  Resp: 18 18  15   Temp: 97.8 F (36.6 C) 98.1 F (36.7 C)   98.2 F (36.8 C)  TempSrc: Oral Oral  Oral  SpO2: 98% 98% 96% 99%  Weight:      Height:       No intake or output data in the 24 hours ending 08/11/22 0854    08/10/2022    9:30 AM 08/10/2022    8:50 AM 08/04/2022    8:38 AM  Last 3 Weights  Weight (lbs) 226 lb 224 lb 223 lb  Weight (kg) 102.513 kg 101.606 kg 101.152 kg     Body mass index is 38.79 kg/m.  General:  Well nourished, well developed, in no acute distress HEENT: normal Neck: no JVD Vascular: No carotid bruits;  Distal pulses 2+ bilaterally Cardiac:  normal S1, S2; RRR; no murmur  Lungs:  mild wheezing  Abd: soft, nontender, no hepatomegaly  Ext: no edema Musculoskeletal:  No deformities, BUE and BLE strength normal and equal Skin: warm and dry  Neuro:  CNs 2-12 intact, no focal abnormalities noted Psych:  Normal affect   EKG:  The EKG was personally reviewed and demonstrates:  Normal sinus rhythm with a heart rate of 70.  Borderline low voltage.  T wave inversions in inferolateral leads same as prior study 07/26/2022 Telemetry:  Telemetry was personally reviewed and demonstrates: Normal sinus rhythm  Relevant CV Studies: Echocardiogram pending  Laboratory Data:  High Sensitivity Troponin:   Recent Labs  Lab 08/10/22 1020 08/10/22 1216  TROPONINIHS 8 7     Chemistry Recent Labs  Lab 08/10/22 1020  NA 136  K 3.4*  CL 103  CO2 26  GLUCOSE 99  BUN 8  CREATININE 0.75  CALCIUM 8.4*  MG 1.8  GFRNONAA >60  ANIONGAP 7    Recent Labs  Lab 08/10/22 1020  PROT 6.7  ALBUMIN 3.7  AST 15  ALT 12  ALKPHOS 94  BILITOT 0.3   Lipids No results for input(s): "CHOL", "TRIG", "HDL", "LABVLDL", "LDLCALC", "CHOLHDL" in the last 168 hours.  Hematology Recent Labs  Lab 08/10/22 1020  WBC 9.6  RBC 5.09  HGB 14.0  HCT 42.4  MCV 83.3  MCH 27.5  MCHC 33.0  RDW 14.5  PLT 452*   Thyroid No results for input(s): "TSH", "FREET4" in the last 168 hours.  BNPNo results for input(s): "BNP", "PROBNP" in the  last 168 hours.  DDimer  Recent Labs  Lab 08/10/22 1020  DDIMER <0.27     Radiology/Studies:  MM 3D SCREENING MAMMOGRAM BILATERAL BREAST  Result Date: 08/10/2022 CLINICAL DATA:  Screening. EXAM: DIGITAL SCREENING BILATERAL MAMMOGRAM WITH TOMOSYNTHESIS AND CAD TECHNIQUE: Bilateral screening digital craniocaudal and mediolateral oblique mammograms were obtained. Bilateral screening digital breast tomosynthesis was performed. The images were evaluated with computer-aided detection. COMPARISON:  Previous exam(s). ACR Breast Density Category b: There are scattered areas of fibroglandular density. FINDINGS: There are no findings suspicious for malignancy. IMPRESSION: No mammographic evidence of malignancy. A result letter of this screening mammogram will be mailed directly to the patient. RECOMMENDATION: Screening mammogram in one year. (Code:SM-B-01Y) BI-RADS CATEGORY  1: Negative. Electronically Signed   By: Abelardo Diesel M.D.   On: 08/10/2022 12:27   DG Chest 2 View  Result Date: 08/10/2022 CLINICAL DATA:  Chest pain EXAM: CHEST - 2 VIEW COMPARISON:  August 16, 2021 FINDINGS: The cardiomediastinal silhouette is unchanged in contour. No focal pulmonary opacity. No pleural effusion or pneumothorax. The visualized upper abdomen is unremarkable. No acute osseous abnormality. IMPRESSION: No active cardiopulmonary disease. Electronically Signed   By: Beryle Flock M.D.   On: 08/10/2022 10:16   US PELVIC COMPLETE WITH TRANSVAGINAL  Result Date: 08/09/2022 CLINICAL DATA:  postmenopausal bleeding EXAM: ULTRASOUND OF PELVIS TECHNIQUE: Transabdominal and transvaginalultrasound examination of the pelvis was performed including evaluation of the uterus, ovaries, adnexal regions, and pelvic cul-de-sac. COMPARISON:  12/17/2011. FINDINGS: Uterusanteverted, 9 x 7 x 6 cm. The endometrium thickened, 0.7 cm. The uterine cavity is empty. Uterine fibroids identified on the right, 3.9 x 3.0 x 2.6 cm and posterior  subserosal 1.7 x 1.3 x 1.3 cm. Right ovary Unremarkable, 2.7 x 2.2 x 2.2 cm. Left ovary Dominant simple follicular cyst 2.9 cm. This need not be followed further. Ovary measurements 3.7 x  2.9 x 2.7 cm. Images of the adnexae demonstrated no masses or fluid collections IMPRESSION: 1. Thickened endometrium. 2. Fibroids. Electronically Signed   By: Sammie Bench M.D.   On: 08/09/2022 14:11     Assessment and Plan:   Chest pain with history of CAD status post PCI to the RCA in 2011 Patient with multiple risk factors with complaints of ongoing complaints of chest pain worsened in the past 1 month associated with nausea/vomiting, shortness of breath and relieved with nitroglycerin.  She was evaluated outpatient by Dr. Audie Box with plans to undergo PET stress testing and echocardiogram.  Troponins have been negative and flat.  EKG noting inferolateral T wave inversions without ST-T wave elevation. Continue aspirin 81 mg, Imdur 30 mg, rosuvastatin 20 mg daily Will discuss with MD about plans for outpatient PET stress testing or possible cardiac catheterization (has contrast dye allergy) Echo pending  Hypertension Continue amlodipine 5 mg daily, losartan daily  Hyperlipidemia Continue rosuvastatin 20 mg. LDL 144. Rosuvastatin dose has just been increased from 10 to 20. Can evaluate again in 3-6 months.  Tobacco use Has stopped for the past 6 days. Currently has nicotine patch.  OSA Patient noting apneic spells while sleeping.  Has elevated Mallampati score.  We discussed about CPAP machine and outpatient referral.  Patient agreeable.  Risk Assessment/Risk Scores:   TIMI Risk Score for Unstable Angina or Non-ST Elevation MI:   The patient's TIMI risk score is 4, which indicates a 20% risk of all cause mortality, new or recurrent myocardial infarction or need for urgent revascularization in the next 14 days.    For questions or updates, please contact [facility] Please consult  [email or web address] for contact info under    Signed, Bonnee Quin, PA-C  08/11/2022 8:54 AM As above, patient seen and examined.  Briefly she is a 52 year old female with past medical history of coronary artery disease, hypertension, hyperlipidemia for evaluation of chest pain.  Patient has had previous myocardial infarction and PCI of the right coronary artery in 2011.  She was seen recently by Dr. Davina Poke with complaints of chest pain and outpatient PET stress was ordered as well as echocardiogram.  These have not been performed.  However she has continued to have chest pain which is worsening in severity.  It is in the left chest area described as a pressure radiating to her left upper extremity.  It is relieved with nitroglycerin.  It is not related to activities and typically last 5 to 20 minutes.  There is associated shortness of breath but no nausea or diaphoresis.  She otherwise denies dyspnea on exertion or syncope.  She was admitted due to worsening chest pain and cardiology asked to evaluate.  Electrocardiogram shows sinus rhythm with inferior infarct and anterior infarct cannot be excluded; inferolateral T wave inversion unchanged compared to March.  Troponins are normal.  Creatinine 0.75, hemoglobin 14.  1 chest pain-symptoms with both typical and atypical features.  However they are progressive and recurring.  I would favor definitive evaluation.  The risk and benefits of cardiac catheterization including myocardial infarction, CVA and death discussed and she agrees to proceed.  She does have a dye allergy and will need to be premedicated with prednisone and Benadryl.  Continue aspirin and statin.  Will also arrange echocardiogram to assess LV function.  2 tobacco abuse-patient counseled on discontinuing.  She has not smoked in the past 6 days by her report.  3 hypertension-will continue preadmission blood pressure medications and  advance as needed.  4 hyperlipidemia-given history of documented  coronary disease will increase Crestor to 40 mg daily.  Check lipids and liver in 8 weeks.  Kirk Ruths, MD

## 2022-08-12 ENCOUNTER — Encounter (HOSPITAL_COMMUNITY): Payer: Self-pay | Admitting: Cardiovascular Disease

## 2022-08-12 DIAGNOSIS — R072 Precordial pain: Secondary | ICD-10-CM | POA: Diagnosis not present

## 2022-08-12 DIAGNOSIS — I10 Essential (primary) hypertension: Secondary | ICD-10-CM | POA: Diagnosis not present

## 2022-08-12 DIAGNOSIS — F17200 Nicotine dependence, unspecified, uncomplicated: Secondary | ICD-10-CM | POA: Diagnosis not present

## 2022-08-12 DIAGNOSIS — E785 Hyperlipidemia, unspecified: Secondary | ICD-10-CM | POA: Diagnosis not present

## 2022-08-12 DIAGNOSIS — R0789 Other chest pain: Secondary | ICD-10-CM | POA: Diagnosis not present

## 2022-08-12 LAB — BASIC METABOLIC PANEL
Anion gap: 15 (ref 5–15)
BUN: 10 mg/dL (ref 6–20)
CO2: 22 mmol/L (ref 22–32)
Calcium: 9.4 mg/dL (ref 8.9–10.3)
Chloride: 100 mmol/L (ref 98–111)
Creatinine, Ser: 0.78 mg/dL (ref 0.44–1.00)
GFR, Estimated: >60 mL/min (ref >60)
Glucose, Bld: 100 mg/dL — ABNORMAL HIGH (ref 70–99)
Potassium: 3.8 mmol/L (ref 3.5–5.1)
Sodium: 137 mmol/L (ref 135–145)

## 2022-08-12 LAB — MAGNESIUM: Magnesium: 2 mg/dL (ref 1.7–2.4)

## 2022-08-12 MED ORDER — AMLODIPINE BESYLATE 10 MG PO TABS: 10.0000 mg | ORAL_TABLET | Freq: Every day | ORAL | 2 refills | Status: DC

## 2022-08-12 MED ORDER — CALCIUM CARBONATE ANTACID 500 MG PO CHEW
1.0000 | CHEWABLE_TABLET | Freq: Four times a day (QID) | ORAL | Status: DC | PRN
  Administered 2022-08-12: 200 mg via ORAL
  Filled 2022-08-12: qty 1

## 2022-08-12 MED ORDER — CARVEDILOL 6.25 MG PO TABS: 6.2500 mg | ORAL_TABLET | Freq: Two times a day (BID) | ORAL | Status: DC

## 2022-08-12 MED ORDER — PANTOPRAZOLE SODIUM 40 MG PO TBEC: 40.0000 mg | DELAYED_RELEASE_TABLET | Freq: Every day | ORAL | 1 refills | Status: DC

## 2022-08-12 MED ORDER — AMLODIPINE BESYLATE 10 MG PO TABS
10.0000 mg | ORAL_TABLET | Freq: Every day | ORAL | Status: DC
  Administered 2022-08-12: 10 mg via ORAL
  Filled 2022-08-12: qty 1

## 2022-08-12 MED ORDER — ROSUVASTATIN CALCIUM 40 MG PO TABS: 40.0000 mg | ORAL_TABLET | Freq: Every day | ORAL | 2 refills | Status: DC

## 2022-08-12 MED ORDER — ISOSORBIDE MONONITRATE ER 60 MG PO TB24: 60.0000 mg | ORAL_TABLET | Freq: Every day | ORAL | 2 refills | Status: DC

## 2022-08-12 MED ORDER — CARVEDILOL 6.25 MG PO TABS: 6.2500 mg | ORAL_TABLET | Freq: Two times a day (BID) | ORAL | 3 refills | Status: DC

## 2022-08-12 MED ORDER — ROSUVASTATIN CALCIUM 20 MG PO TABS
40.0000 mg | ORAL_TABLET | Freq: Every day | ORAL | Status: DC
  Administered 2022-08-12: 40 mg via ORAL
  Filled 2022-08-12: qty 2

## 2022-08-12 MED ORDER — ISOSORBIDE MONONITRATE ER 60 MG PO TB24
60.0000 mg | ORAL_TABLET | Freq: Every day | ORAL | Status: DC
  Administered 2022-08-12: 60 mg via ORAL
  Filled 2022-08-12: qty 1

## 2022-08-12 MED ORDER — ASPIRIN 81 MG PO TBEC: 81.0000 mg | DELAYED_RELEASE_TABLET | Freq: Every day | ORAL | 12 refills | Status: AC

## 2022-08-12 NOTE — Progress Notes (Signed)
Rounding Note    Patient Name: Witten Register Date of Encounter: 08/12/2022   [facility] Cardiologist: Reatha Harps, MD   Subjective   No CP or dyspnea  Inpatient Medications    Scheduled Meds:  amLODipine  5 mg Oral Daily   aspirin EC  81 mg Oral Daily   enoxaparin (LOVENOX) injection  40 mg Subcutaneous Q24H   isosorbide mononitrate  30 mg Oral Daily   losartan  50 mg Oral Daily   nicotine  14 mg Transdermal Q2200   pneumococcal 20-valent conjugate vaccine  0.5 mL Intramuscular Tomorrow-1000   rosuvastatin  20 mg Oral Daily   sodium chloride flush  3 mL Intravenous Q12H   Continuous Infusions:  sodium chloride     PRN Meds: sodium chloride, acetaminophen, calcium carbonate, hydrALAZINE, ondansetron (ZOFRAN) IV, sodium chloride flush   Vital Signs    Vitals:   08/11/22 1800 08/11/22 1948 08/12/22 0018 08/12/22 0404  BP:  (!) 166/96 (!) 156/101 (!) 159/102  Pulse: (!) 102 98 (!) 106 (!) 106  Resp:  17 18 19   Temp:  98 F (36.7 C) 98 F (36.7 C) 98 F (36.7 C)  TempSrc:  Oral Oral Oral  SpO2: 93% 95% 95% 95%  Weight:      Height:        Intake/Output Summary (Last 24 hours) at 08/12/2022 0807 Last data filed at 08/12/2022 0314 Gross per 24 hour  Intake 630 ml  Output --  Net 630 ml      08/10/2022    9:30 AM 08/10/2022    8:50 AM 08/04/2022    8:38 AM  Last 3 Weights  Weight (lbs) 226 lb 224 lb 223 lb  Weight (kg) 102.513 kg 101.606 kg 101.152 kg      Telemetry    Sinus - Personally Reviewed   Physical Exam   GEN: No acute distress.   Neck: No JVD Cardiac: RRR, no murmurs, rubs, or gallops.  Respiratory: Clear to auscultation bilaterally. GI: Soft, nontender, non-distended  MS: No edema; radial cath site with no hematoma Neuro:  Nonfocal  Psych: Normal affect   Labs    High Sensitivity Troponin:   Recent Labs  Lab 08/10/22 1020 08/10/22 1216  TROPONINIHS 8 7     Chemistry Recent Labs  Lab 08/10/22 1020  NA 136  K  3.4*  CL 103  CO2 26  GLUCOSE 99  BUN 8  CREATININE 0.75  CALCIUM 8.4*  MG 1.8  PROT 6.7  ALBUMIN 3.7  AST 15  ALT 12  ALKPHOS 94  BILITOT 0.3  GFRNONAA >60  ANIONGAP 7    Hematology Recent Labs  Lab 08/10/22 1020  WBC 9.6  RBC 5.09  HGB 14.0  HCT 42.4  MCV 83.3  MCH 27.5  MCHC 33.0  RDW 14.5  PLT 452*    DDimer  Recent Labs  Lab 08/10/22 1020  DDIMER <0.27     Radiology    CARDIAC CATHETERIZATION  Result Date: 08/11/2022   Prox RCA lesion is 30% stenosed.   Mid RCA lesion is 50% stenosed.   RPDA lesion is 50% stenosed.   2nd Mrg lesion is 50% stenosed.   Dist RCA lesion is 30% stenosed.   Mid LAD lesion is 50% stenosed. 1.  Patent left main with no stenosis 2.  Moderate mid LAD stenosis, does not appear flow obstructive 3.  Moderate stenosis at the ostium of the small OM branch and the distal circumflex 4.  Moderate mid RCA stenosis of 50%, appears nonobstructive 5.  Normal LVEDP Recommendations: The patient has no high-grade coronary artery disease.  With atypical angina, normal cardiac biomarkers, and no critical stenoses, favor management with aggressive medical therapy, lifestyle modification, and continued tobacco cessation.   ECHOCARDIOGRAM COMPLETE  Result Date: 08/11/2022    ECHOCARDIOGRAM REPORT   Patient Name:   Nyoka LintRHONDA Charnley Date of Exam: 08/11/2022 Medical Rec #:  [id number]        Height:       64.0 in Accession #:    [id number]       Weight:       226.0 lb Date of Birth:  February 12, 1971       BSA:          2.061 m Patient Age:    52 years         BP:           167/87 mmHg Patient Gender: F                HR:           74 bpm. Exam Location:  Inpatient Procedure: 2D Echo, Cardiac Doppler and Color Doppler Indications:    Chest Pain  History:        Patient has no prior history of Echocardiogram examinations.                 CAD, Signs/Symptoms:Chest Pain; Risk Factors:Hypertension and                 Current Smoker.  Sonographer:    Lucy AntiguaAmber Murphy Referring Phys:  [id number] CHING T TU IMPRESSIONS  1. Left ventricular ejection fraction, by estimation, is 50%. Left ventricular ejection fraction by PLAX is 50 %. The left ventricle has mildly decreased function. The left ventricle demonstrates regional wall motion abnormalities (see scoring diagram/findings for description). Left ventricular diastolic parameters are consistent with Grade I diastolic dysfunction (impaired relaxation).  2. Right ventricular systolic function is normal. The right ventricular size is normal. There is normal pulmonary artery systolic pressure.  3. The mitral valve is normal in structure. Mild mitral valve regurgitation. No evidence of mitral stenosis.  4. The aortic valve is tricuspid. Aortic valve regurgitation is not visualized. No aortic stenosis is present.  5. The inferior vena cava is normal in size with greater than 50% respiratory variability, suggesting right atrial pressure of 3 mmHg. FINDINGS  Left Ventricle: Left ventricular ejection fraction, by estimation, is 50%. Left ventricular ejection fraction by PLAX is 50 %. The left ventricle has mildly decreased function. The left ventricle demonstrates regional wall motion abnormalities. The left  ventricular internal cavity size was normal in size. There is no left ventricular hypertrophy. Left ventricular diastolic parameters are consistent with Grade I diastolic dysfunction (impaired relaxation). Indeterminate filling pressures.  LV Wall Scoring: The entire inferior wall is hypokinetic. The entire anterior wall, entire lateral wall, entire septum, and apex are normal. Right Ventricle: The right ventricular size is normal. No increase in right ventricular wall thickness. Right ventricular systolic function is normal. There is normal pulmonary artery systolic pressure. The tricuspid regurgitant velocity is 2.37 m/s, and  with an assumed right atrial pressure of 3 mmHg, the estimated right ventricular systolic pressure is 25.5 mmHg. Left Atrium:  Left atrial size was normal in size. Right Atrium: Right atrial size was normal in size. Pericardium: There is no evidence of pericardial effusion. Mitral Valve: The mitral valve is normal in structure. Mild mitral  annular calcification. Mild mitral valve regurgitation. No evidence of mitral valve stenosis. Tricuspid Valve: The tricuspid valve is normal in structure. Tricuspid valve regurgitation is trivial. No evidence of tricuspid stenosis. Aortic Valve: The aortic valve is tricuspid. Aortic valve regurgitation is not visualized. No aortic stenosis is present. Aortic valve mean gradient measures 3.0 mmHg. Aortic valve peak gradient measures 6.7 mmHg. Aortic valve area, by VTI measures 3.11 cm. Pulmonic Valve: The pulmonic valve was normal in structure. Pulmonic valve regurgitation is not visualized. No evidence of pulmonic stenosis. Aorta: The aortic root is normal in size and structure. Venous: The inferior vena cava is normal in size with greater than 50% respiratory variability, suggesting right atrial pressure of 3 mmHg. IAS/Shunts: No atrial level shunt detected by color flow Doppler.  LEFT VENTRICLE PLAX 2D LV EF:         Left            Diastology                ventricular     LV e' medial:    5.55 cm/s                ejection        LV E/e' medial:  10.3                fraction by     LV e' lateral:   8.59 cm/s                PLAX is 50      LV E/e' lateral: 6.6                %. LVIDd:         5.10 cm LVIDs:         3.80 cm LV PW:         1.00 cm LV IVS:        0.90 cm LVOT diam:     2.00 cm LV SV:         73 LV SV Index:   35 LVOT Area:     3.14 cm  LV Volumes (MOD) LV vol d, MOD    59.8 ml A4C: LV vol s, MOD    46.2 ml A4C: LV SV MOD A4C:   59.8 ml RIGHT VENTRICLE TAPSE (M-mode): 2.5 cm LEFT ATRIUM             Index        RIGHT ATRIUM           Index LA Vol (A2C):   53.0 ml 25.72 ml/m  RA Area:     14.30 cm LA Vol (A4C):   36.0 ml 17.47 ml/m  RA Volume:   33.80 ml  16.40 ml/m LA Biplane Vol:  46.2 ml 22.42 ml/m  AORTIC VALVE AV Area (Vmax):    2.51 cm AV Area (Vmean):   2.56 cm AV Area (VTI):     3.11 cm AV Vmax:           129.00 cm/s AV Vmean:          75.600 cm/s AV VTI:            0.234 m AV Peak Grad:      6.7 mmHg AV Mean Grad:      3.0 mmHg LVOT Vmax:         103.00 cm/s LVOT Vmean:        61.700 cm/s LVOT VTI:  0.232 m LVOT/AV VTI ratio: 0.99  AORTA Ao Root diam: 3.00 cm Ao Asc diam:  3.40 cm MITRAL VALVE               TRICUSPID VALVE MV Area (PHT): 3.93 cm    TR Peak grad:   22.5 mmHg MV Decel Time: 193 msec    TR Vmax:        237.00 cm/s MR Peak grad: 108.6 mmHg MR Vmax:      521.00 cm/s  SHUNTS MV E velocity: 57.00 cm/s  Systemic VTI:  0.23 m MV A velocity: 65.60 cm/s  Systemic Diam: 2.00 cm MV E/A ratio:  0.87 Chilton Siiffany [place]wood MD Electronically signed by Chilton Siiffany [place] MD Signature Date/Time: 08/11/2022/2:17:29 PM    Final    DG Chest 2 View  Result Date: 08/10/2022 CLINICAL DATA:  Chest pain EXAM: CHEST - 2 VIEW COMPARISON:  August 16, 2021 FINDINGS: The cardiomediastinal silhouette is unchanged in contour. No focal pulmonary opacity. No pleural effusion or pneumothorax. The visualized upper abdomen is unremarkable. No acute osseous abnormality. IMPRESSION: No active cardiopulmonary disease. Electronically Signed   By: Jacob MooresMeghana  Konanur M.D.   On: 08/10/2022 10:16     Patient Profile     52 y.o. female with past medical history of coronary artery disease, hypertension, hyperlipidemia admitted with recurrent chest pain.  Assessment & Plan    1 chest pain-patient had minimal chest pain earlier today but none at present.  Symptoms are atypical and enzymes are negative.  Cardiac catheterization revealed moderate coronary disease that is felt to be nonobstructive.  Echocardiogram shows preserved LV function with inferior hypokinesis.  Plan is medical therapy.  Continue aspirin and increase Crestor to 40 mg daily.  Continue amlodipine and isosorbide.  2  hypertension-patient's blood pressure is elevated.  Increase amlodipine to 10 mg daily and isosorbide to 60 mg daily.  Follow blood pressure and advance medications as an outpatient.  3 hyperlipidemia-given documented coronary disease will increase Crestor to 40 mg daily.  Check lipids and liver in 8 weeks.  4 tobacco abuse-patient counseled on discontinuing.  5 obstructive sleep apnea-outpatient follow-up.  Patient can be discharged from a cardiac standpoint on present medications.  We will arrange follow-up with APP in 2 to 4 weeks.  Cardiology will sign off.  Please call with questions.  For questions or updates, please contact [facility] Please consult [email or web address] for contact info under        Signed, Olga MillersBrian Xai Frerking, MD  08/12/2022, 8:07 AM

## 2022-08-12 NOTE — Discharge Summary (Signed)
Physician Discharge Summary   Patient: Amy Wade MRN: [id number] DOB: [birth year]  Admit date:     08/10/2022  Discharge date: 08/12/22  Discharge Physician: Kathlen ModyVijaya Khaliya Golinski   PCP: Olive BassMurray, Laura Woodruff, FNP   Recommendations at discharge:  Please follow up with PCP in one week.  Please follow up with cardiology as scheduled.  Please follow up with Gastroenterology to evaluate for GERD causing chest pain.   Discharge Diagnoses: Principal Problem:   Chest pain Active Problems:   Coronary artery disease involving native coronary artery of native heart with angina pectoris   Essential hypertension   Tobacco use disorder   Hyperlipidemia   Hypokalemia   Hypocalcemia   Obesity (BMI 30-39.9)    Hospital Course: Amy LintRhonda Wade is a 52 y.o. female with medical history significant of HTN, HLD, CAD s/p PCI who presents as a transfer for chest pain.   Assessment and Plan: * Chest pain -hx of CAD s/p PCI RCA in 2012 with reported LHC in 2016 which showed no evidence of new coronary lesions and widely patent right coronary artery stent.  -Troponin reassuring and flat at 8-7. EKG on my review remained similar to prior to several weeks ago with inferior lateral T wave inversion. Cardiac catheterization revealed moderate coronary disease , and appears nonobstructive. Echocardiogram shows preserved LV function with inferior hypokinesis . Cardiology recommended medical therapy.  Continue with aspirin, statin, and coreg.    Obesity (BMI 30-39.9) BMI of 38  Hypocalcemia Replaced.   Hypokalemia Replaced.   Hyperlipidemia -continue statin  Tobacco use disorder -encourage continual cessation  Essential hypertension -uncontrolled.  Added coreg, imdur, amlodipine, and losartan.           Consultants: cardiology.  Procedures performed: cardiac cath.   Disposition: Home Diet recommendation:  Discharge Diet Orders (From admission, onward)     Start     Ordered    08/12/22 0000  Diet - low sodium heart healthy        08/12/22 0825           Regular diet DISCHARGE MEDICATION: Allergies as of 08/12/2022       Reactions   Ivp Dye [iodinated Contrast Media] Hives        Medication List     TAKE these medications    amLODipine 10 MG tablet Commonly known as: NORVASC Take 1 tablet (10 mg total) by mouth daily. What changed:  medication strength how much to take   aspirin EC 81 MG tablet Take 1 tablet (81 mg total) by mouth daily. Swallow whole.   carvedilol 6.25 MG tablet Commonly known as: COREG Take 1 tablet (6.25 mg total) by mouth 2 (two) times daily with a meal.   isosorbide mononitrate 60 MG 24 hr tablet Commonly known as: IMDUR Take 1 tablet (60 mg total) by mouth daily.   losartan 50 MG tablet Commonly known as: COZAAR Take 1 tablet (50 mg total) by mouth daily.   nitroGLYCERIN 0.4 MG SL tablet Commonly known as: NITROSTAT Place 1 tablet (0.4 mg total) under the tongue every 5 (five) minutes as needed for chest pain.   pantoprazole 40 MG tablet Commonly known as: Protonix Take 1 tablet (40 mg total) by mouth daily.   rosuvastatin 40 MG tablet Commonly known as: Crestor Take 1 tablet (40 mg total) by mouth daily. What changed:  medication strength how much to take        Discharge Exam: Filed Weights   08/10/22 0930  Weight: 102.5 kg  General exam: Appears calm and comfortable  Respiratory system: Clear to auscultation. Respiratory effort normal. Cardiovascular system: S1 & S2 heard, RRR. No JVD,  No pedal edema. Gastrointestinal system: Abdomen is nondistended, soft and nontender.  Central nervous system: Alert and oriented. No focal neurological deficits. Extremities: Symmetric 5 x 5 power. Skin: No rashes,  Psychiatry:  Mood & affect appropriate.    Condition at discharge: fair  The results of significant diagnostics from this hospitalization (including imaging, microbiology, ancillary and  laboratory) are listed below for reference.   Imaging Studies: CARDIAC CATHETERIZATION  Result Date: 08/11/2022   Prox RCA lesion is 30% stenosed.   Mid RCA lesion is 50% stenosed.   RPDA lesion is 50% stenosed.   2nd Mrg lesion is 50% stenosed.   Dist RCA lesion is 30% stenosed.   Mid LAD lesion is 50% stenosed. 1.  Patent left main with no stenosis 2.  Moderate mid LAD stenosis, does not appear flow obstructive 3.  Moderate stenosis at the ostium of the small OM branch and the distal circumflex 4.  Moderate mid RCA stenosis of 50%, appears nonobstructive 5.  Normal LVEDP Recommendations: The patient has no high-grade coronary artery disease.  With atypical angina, normal cardiac biomarkers, and no critical stenoses, favor management with aggressive medical therapy, lifestyle modification, and continued tobacco cessation.   ECHOCARDIOGRAM COMPLETE  Result Date: 08/11/2022    ECHOCARDIOGRAM REPORT   Patient Name:   Amy Wade Date of Exam: 08/11/2022 Medical Rec #:  [id number]        Height:       64.0 in Accession #:    [id number]       Weight:       226.0 lb Date of Birth:  Sep 16, 1970       BSA:          2.061 m Patient Age:    51 years         BP:           167/87 mmHg Patient Gender: F                HR:           74 bpm. Exam Location:  Inpatient Procedure: 2D Echo, Cardiac Doppler and Color Doppler Indications:    Chest Pain  History:        Patient has no prior history of Echocardiogram examinations.                 CAD, Signs/Symptoms:Chest Pain; Risk Factors:Hypertension and                 Current Smoker.  Sonographer:    Lucy Antigua Referring Phys: [id number] CHING T TU IMPRESSIONS  1. Left ventricular ejection fraction, by estimation, is 50%. Left ventricular ejection fraction by PLAX is 50 %. The left ventricle has mildly decreased function. The left ventricle demonstrates regional wall motion abnormalities (see scoring diagram/findings for description). Left ventricular diastolic parameters  are consistent with Grade I diastolic dysfunction (impaired relaxation).  2. Right ventricular systolic function is normal. The right ventricular size is normal. There is normal pulmonary artery systolic pressure.  3. The mitral valve is normal in structure. Mild mitral valve regurgitation. No evidence of mitral stenosis.  4. The aortic valve is tricuspid. Aortic valve regurgitation is not visualized. No aortic stenosis is present.  5. The inferior vena cava is normal in size with greater than 50% respiratory variability, suggesting right atrial pressure of 3 mmHg.  FINDINGS  Left Ventricle: Left ventricular ejection fraction, by estimation, is 50%. Left ventricular ejection fraction by PLAX is 50 %. The left ventricle has mildly decreased function. The left ventricle demonstrates regional wall motion abnormalities. The left  ventricular internal cavity size was normal in size. There is no left ventricular hypertrophy. Left ventricular diastolic parameters are consistent with Grade I diastolic dysfunction (impaired relaxation). Indeterminate filling pressures.  LV Wall Scoring: The entire inferior wall is hypokinetic. The entire anterior wall, entire lateral wall, entire septum, and apex are normal. Right Ventricle: The right ventricular size is normal. No increase in right ventricular wall thickness. Right ventricular systolic function is normal. There is normal pulmonary artery systolic pressure. The tricuspid regurgitant velocity is 2.37 m/s, and  with an assumed right atrial pressure of 3 mmHg, the estimated right ventricular systolic pressure is 25.5 mmHg. Left Atrium: Left atrial size was normal in size. Right Atrium: Right atrial size was normal in size. Pericardium: There is no evidence of pericardial effusion. Mitral Valve: The mitral valve is normal in structure. Mild mitral annular calcification. Mild mitral valve regurgitation. No evidence of mitral valve stenosis. Tricuspid Valve: The tricuspid valve is  normal in structure. Tricuspid valve regurgitation is trivial. No evidence of tricuspid stenosis. Aortic Valve: The aortic valve is tricuspid. Aortic valve regurgitation is not visualized. No aortic stenosis is present. Aortic valve mean gradient measures 3.0 mmHg. Aortic valve peak gradient measures 6.7 mmHg. Aortic valve area, by VTI measures 3.11 cm. Pulmonic Valve: The pulmonic valve was normal in structure. Pulmonic valve regurgitation is not visualized. No evidence of pulmonic stenosis. Aorta: The aortic root is normal in size and structure. Venous: The inferior vena cava is normal in size with greater than 50% respiratory variability, suggesting right atrial pressure of 3 mmHg. IAS/Shunts: No atrial level shunt detected by color flow Doppler.  LEFT VENTRICLE PLAX 2D LV EF:         Left            Diastology                ventricular     LV e' medial:    5.55 cm/s                ejection        LV E/e' medial:  10.3                fraction by     LV e' lateral:   8.59 cm/s                PLAX is 50      LV E/e' lateral: 6.6                %. LVIDd:         5.10 cm LVIDs:         3.80 cm LV PW:         1.00 cm LV IVS:        0.90 cm LVOT diam:     2.00 cm LV SV:         73 LV SV Index:   35 LVOT Area:     3.14 cm  LV Volumes (MOD) LV vol d, MOD    59.8 ml A4C: LV vol s, MOD    46.2 ml A4C: LV SV MOD A4C:   59.8 ml RIGHT VENTRICLE TAPSE (M-mode): 2.5 cm LEFT ATRIUM  Index        RIGHT ATRIUM           Index LA Vol (A2C):   53.0 ml 25.72 ml/m  RA Area:     14.30 cm LA Vol (A4C):   36.0 ml 17.47 ml/m  RA Volume:   33.80 ml  16.40 ml/m LA Biplane Vol: 46.2 ml 22.42 ml/m  AORTIC VALVE AV Area (Vmax):    2.51 cm AV Area (Vmean):   2.56 cm AV Area (VTI):     3.11 cm AV Vmax:           129.00 cm/s AV Vmean:          75.600 cm/s AV VTI:            0.234 m AV Peak Grad:      6.7 mmHg AV Mean Grad:      3.0 mmHg LVOT Vmax:         103.00 cm/s LVOT Vmean:        61.700 cm/s LVOT VTI:          0.232 m  LVOT/AV VTI ratio: 0.99  AORTA Ao Root diam: 3.00 cm Ao Asc diam:  3.40 cm MITRAL VALVE               TRICUSPID VALVE MV Area (PHT): 3.93 cm    TR Peak grad:   22.5 mmHg MV Decel Time: 193 msec    TR Vmax:        237.00 cm/s MR Peak grad: 108.6 mmHg MR Vmax:      521.00 cm/s  SHUNTS MV E velocity: 57.00 cm/s  Systemic VTI:  0.23 m MV A velocity: 65.60 cm/s  Systemic Diam: 2.00 cm MV E/A ratio:  0.87 Chilton Si MD Electronically signed by Chilton Si MD Signature Date/Time: 08/11/2022/2:17:29 PM    Final    MM 3D SCREENING MAMMOGRAM BILATERAL BREAST  Result Date: 08/10/2022 CLINICAL DATA:  Screening. EXAM: DIGITAL SCREENING BILATERAL MAMMOGRAM WITH TOMOSYNTHESIS AND CAD TECHNIQUE: Bilateral screening digital craniocaudal and mediolateral oblique mammograms were obtained. Bilateral screening digital breast tomosynthesis was performed. The images were evaluated with computer-aided detection. COMPARISON:  Previous exam(s). ACR Breast Density Category b: There are scattered areas of fibroglandular density. FINDINGS: There are no findings suspicious for malignancy. IMPRESSION: No mammographic evidence of malignancy. A result letter of this screening mammogram will be mailed directly to the patient. RECOMMENDATION: Screening mammogram in one year. (Code:SM-B-01Y) BI-RADS CATEGORY  1: Negative. Electronically Signed   By: Sherian Rein M.D.   On: 08/10/2022 12:27   DG Chest 2 View  Result Date: 08/10/2022 CLINICAL DATA:  Chest pain EXAM: CHEST - 2 VIEW COMPARISON:  August 16, 2021 FINDINGS: The cardiomediastinal silhouette is unchanged in contour. No focal pulmonary opacity. No pleural effusion or pneumothorax. The visualized upper abdomen is unremarkable. No acute osseous abnormality. IMPRESSION: No active cardiopulmonary disease. Electronically Signed   By: Jacob Moores M.D.   On: 08/10/2022 10:16   US PELVIC COMPLETE WITH TRANSVAGINAL  Result Date: 08/09/2022 CLINICAL DATA:  postmenopausal bleeding  EXAM: ULTRASOUND OF PELVIS TECHNIQUE: Transabdominal and transvaginalultrasound examination of the pelvis was performed including evaluation of the uterus, ovaries, adnexal regions, and pelvic cul-de-sac. COMPARISON:  12/17/2011. FINDINGS: Uterusanteverted, 9 x 7 x 6 cm. The endometrium thickened, 0.7 cm. The uterine cavity is empty. Uterine fibroids identified on the right, 3.9 x 3.0 x 2.6 cm and posterior subserosal 1.7 x 1.3 x 1.3 cm. Right ovary Unremarkable, 2.7 x  2.2 x 2.2 cm. Left ovary Dominant simple follicular cyst 2.9 cm. This need not be followed further. Ovary measurements 3.7 x 2.9 x 2.7 cm. Images of the adnexae demonstrated no masses or fluid collections IMPRESSION: 1. Thickened endometrium. 2. Fibroids. Electronically Signed   By: Layla MawJoshua  Pleasure M.D.   On: 08/09/2022 14:11    Microbiology: Results for orders placed or performed during the hospital encounter of 12/13/10  Urine culture     Status: None   Collection Time: 12/13/10  9:06 PM   Specimen: Urine, Random  Result Value Ref Range Status   Specimen Description URINE, RANDOM  Final   Special Requests NONE  Final   Culture  Setup Time [id number]  Final   Colony Count 80,000 COLONIES/ML  Final   Culture   Final    Multiple bacterial morphotypes present, none predominant. Suggest appropriate recollection if clinically indicated.   Report Status 12/15/2010 FINAL  Final  Wet prep, genital     Status: Abnormal   Collection Time: 12/14/10 12:31 AM   Specimen: Cervix; Genital  Result Value Ref Range Status   Yeast Wet Prep HPF POC NONE SEEN NONE SEEN Final   Trich, Wet Prep TOO NUMEROUS TO COUNT (A) NONE SEEN Final   Clue Cells Wet Prep HPF POC TOO NUMEROUS TO COUNT (A) NONE SEEN Final   WBC, Wet Prep HPF POC MANY (A) NONE SEEN Final    Comment: MANY BACTERIA SEEN    Labs: CBC: Recent Labs  Lab 08/10/22 1020  WBC 9.6  NEUTROABS 5.1  HGB 14.0  HCT 42.4  MCV 83.3  PLT 452*   Basic Metabolic Panel: Recent Labs   Lab 08/10/22 1020 08/12/22 1011  NA 136 137  K 3.4* 3.8  CL 103 100  CO2 26 22  GLUCOSE 99 100*  BUN 8 10  CREATININE 0.75 0.78  CALCIUM 8.4* 9.4  MG 1.8 2.0   Liver Function Tests: Recent Labs  Lab 08/10/22 1020  AST 15  ALT 12  ALKPHOS 94  BILITOT 0.3  PROT 6.7  ALBUMIN 3.7   CBG: No results for input(s): "GLUCAP" in the last 168 hours.  Discharge time spent: 46 minutes.   Signed: Kathlen ModyVijaya Marquisha Nikolov, MD [facility] 08/12/2022

## 2022-08-12 NOTE — Progress Notes (Addendum)
Notified by RN that patient had an episode of chest pain while going through the AVS. Patient was having Ventricular Bigeminy at the time. When bigeminy resolved, her symptoms went away.   I evaluated the patient who is now chest pain free. She reports that the episode of chest pain that she had this morning was similar to the pain that caused her to come to the hospital. We discussed that her cardiac catheterization showed nonobstructive disease and she had negative cardiac enzymes. This is reassuring. Also discussed that we had added imdur to her medication list, which can help relieve cardiac chest pain. Started carvedilol 6.25 mg BID for PVC suppression and to help control BP (last BP recorded was 183/101)  Patient would still like to go home today. OK to be discharged. Follow up appointment arranged for 4/19.   Jonita Albee, PA-C 08/12/2022 9:37 AM

## 2022-08-13 LAB — LIPOPROTEIN A (LPA): Lipoprotein (a): 41.6 nmol/L — ABNORMAL HIGH (ref <75.0)

## 2022-08-15 ENCOUNTER — Telehealth: Payer: Self-pay

## 2022-08-15 ENCOUNTER — Encounter: Payer: Self-pay | Admitting: Family Medicine

## 2022-08-15 LAB — EMPOWER GYN GUIDELINES (2+17): REPORT SUMMARY: NEGATIVE

## 2022-08-15 NOTE — Transitions of Care (Post Inpatient/ED Visit) (Signed)
   08/15/2022  Name: Amy Wade MRN: [id number] DOB: Oct 14, 1970  Today's TOC FU Call Status: Today's TOC FU Call Status:: Successful TOC FU Call Competed TOC FU Call Complete Date: 08/15/22  Transition Care Management Follow-up Telephone Call Date of Discharge: 08/12/22 Discharge Facility: [facility]) Type of Discharge: Inpatient Admission Primary Inpatient Discharge Diagnosis:: precordial pain How have you been since you were released from the hospital?: Better  Items Reviewed: Did you receive and understand the discharge instructions provided?: Yes Medications obtained and verified?: Yes (Medications Reviewed) Any new allergies since your discharge?: No Dietary orders reviewed?: Yes Do you have support at home?: No  Home Care and Equipment/Supplies: Were Home Health Services Ordered?: NA Any new equipment or medical supplies ordered?: NA  Functional Questionnaire: Do you need assistance with bathing/showering or dressing?: No Do you need assistance with meal preparation?: No Do you need assistance with eating?: No Do you have difficulty maintaining continence: No Do you need assistance with getting out of bed/getting out of a chair/moving?: No Do you have difficulty managing or taking your medications?: No  Follow up appointments reviewed:      SIGNATURE Karena Addison, LPN [facility] Nurse Health Advisor Direct Dial [phone]

## 2022-08-24 ENCOUNTER — Ambulatory Visit (HOSPITAL_COMMUNITY): Payer: 59

## 2022-08-24 ENCOUNTER — Ambulatory Visit: Payer: 59 | Admitting: General Practice

## 2022-08-25 NOTE — Progress Notes (Deleted)
Cardiology Office Note:    Date:  08/25/2022   ID:  Amy Wade, DOB [birth year], MRN [id number]  PCP:  Olive Bass, FNP  [facility] Providers Cardiologist:  Reatha Harps, MD { Click to update primary MD,subspecialty MD or APP then REFRESH:1}  *** Referring MD: Olive Bass,*   Chief Complaint:  No chief complaint on file. {Click here for Visit Info    :1}   Patient Profile:  Non-obstructive CAD Patient admitted 08/10/22-08/12/22 with chest pain. LHC with moderate mid LAD stenosis, moderate stenosis at ostium of small OM branch and distal circumflex, moderate mid RCA stenosis of 50%.  Hypertension Hyperlipidemia OSA Tobacco use  Cardiac Studies & Procedures   CARDIAC CATHETERIZATION  CARDIAC CATHETERIZATION 08/11/2022  Narrative   Prox RCA lesion is 30% stenosed.   Mid RCA lesion is 50% stenosed.   RPDA lesion is 50% stenosed.   2nd Mrg lesion is 50% stenosed.   Dist RCA lesion is 30% stenosed.   Mid LAD lesion is 50% stenosed.  1.  Patent left main with no stenosis 2.  Moderate mid LAD stenosis, does not appear flow obstructive 3.  Moderate stenosis at the ostium of the small OM branch and the distal circumflex 4.  Moderate mid RCA stenosis of 50%, appears nonobstructive 5.  Normal LVEDP  Recommendations: The patient has no high-grade coronary artery disease.  With atypical angina, normal cardiac biomarkers, and no critical stenoses, favor management with aggressive medical therapy, lifestyle modification, and continued tobacco cessation.  Findings Coronary Findings Diagnostic  Dominance: Right  Left Main The left main is patent with no stenosis.  Divides into the LAD and left circumflex.  Left Anterior Descending The LAD is patent with mild irregularity, except for moderate stenosis in the mid vessel estimated at 50 to 60%. Mid LAD lesion is 50% stenosed.  Left Circumflex The circumflex is patent with no high-grade stenosis.   There is a 50% stenosis at the origin of the second OM.  This is a relatively small caliber vessel.  Second Obtuse Marginal Branch 2nd Mrg lesion is 50% stenosed.  Right Coronary Artery Dominant vessel.  Mild plaquing in the proximal and distal vessel.  The mid vessel has a 50% stenosis.  The PDA has nonobstructive plaquing as well. Prox RCA lesion is 30% stenosed. Mid RCA lesion is 50% stenosed. Dist RCA lesion is 30% stenosed.  Right Posterior Descending Artery RPDA lesion is 50% stenosed.  Intervention  No interventions have been documented.     ECHOCARDIOGRAM  ECHOCARDIOGRAM COMPLETE 08/11/2022  Narrative ECHOCARDIOGRAM REPORT    Patient Name:   Amy Wade Date of Exam: 08/11/2022 Medical Rec #:  [id number]        Height:       64.0 in Accession #:    [id number]       Weight:       226.0 lb Date of Birth:  1971-04-29       BSA:          2.061 m Patient Age:    52 years         BP:           167/87 mmHg Patient Gender: F                HR:           74 bpm. Exam Location:  Inpatient  Procedure: 2D Echo, Cardiac Doppler and Color Doppler  Indications:    Chest Pain  History:        Patient has no prior history of Echocardiogram examinations. CAD, Signs/Symptoms:Chest Pain; Risk Factors:Hypertension and Current Smoker.  Sonographer:    Lucy Antigua Referring Phys: [id number] CHING T TU  IMPRESSIONS   1. Left ventricular ejection fraction, by estimation, is 50%. Left ventricular ejection fraction by PLAX is 50 %. The left ventricle has mildly decreased function. The left ventricle demonstrates regional wall motion abnormalities (see scoring diagram/findings for description). Left ventricular diastolic parameters are consistent with Grade I diastolic dysfunction (impaired relaxation). 2. Right ventricular systolic function is normal. The right ventricular size is normal. There is normal pulmonary artery systolic pressure. 3. The mitral valve is normal in structure.  Mild mitral valve regurgitation. No evidence of mitral stenosis. 4. The aortic valve is tricuspid. Aortic valve regurgitation is not visualized. No aortic stenosis is present. 5. The inferior vena cava is normal in size with greater than 50% respiratory variability, suggesting right atrial pressure of 3 mmHg.  FINDINGS Left Ventricle: Left ventricular ejection fraction, by estimation, is 50%. Left ventricular ejection fraction by PLAX is 50 %. The left ventricle has mildly decreased function. The left ventricle demonstrates regional wall motion abnormalities. The left ventricular internal cavity size was normal in size. There is no left ventricular hypertrophy. Left ventricular diastolic parameters are consistent with Grade I diastolic dysfunction (impaired relaxation). Indeterminate filling pressures.   LV Wall Scoring: The entire inferior wall is hypokinetic. The entire anterior wall, entire lateral wall, entire septum, and apex are normal.  Right Ventricle: The right ventricular size is normal. No increase in right ventricular wall thickness. Right ventricular systolic function is normal. There is normal pulmonary artery systolic pressure. The tricuspid regurgitant velocity is 2.37 m/s, and with an assumed right atrial pressure of 3 mmHg, the estimated right ventricular systolic pressure is 25.5 mmHg.  Left Atrium: Left atrial size was normal in size.  Right Atrium: Right atrial size was normal in size.  Pericardium: There is no evidence of pericardial effusion.  Mitral Valve: The mitral valve is normal in structure. Mild mitral annular calcification. Mild mitral valve regurgitation. No evidence of mitral valve stenosis.  Tricuspid Valve: The tricuspid valve is normal in structure. Tricuspid valve regurgitation is trivial. No evidence of tricuspid stenosis.  Aortic Valve: The aortic valve is tricuspid. Aortic valve regurgitation is not visualized. No aortic stenosis is present. Aortic  valve mean gradient measures 3.0 mmHg. Aortic valve peak gradient measures 6.7 mmHg. Aortic valve area, by VTI measures 3.11 cm.  Pulmonic Valve: The pulmonic valve was normal in structure. Pulmonic valve regurgitation is not visualized. No evidence of pulmonic stenosis.  Aorta: The aortic root is normal in size and structure.  Venous: The inferior vena cava is normal in size with greater than 50% respiratory variability, suggesting right atrial pressure of 3 mmHg.  IAS/Shunts: No atrial level shunt detected by color flow Doppler.   LEFT VENTRICLE PLAX 2D LV EF:         Left            Diastology ventricular     LV e' medial:    5.55 cm/s ejection        LV E/e' medial:  10.3 fraction by     LV e' lateral:   8.59 cm/s PLAX is 50      LV E/e' lateral: 6.6 %. LVIDd:         5.10 cm LVIDs:         3.80 cm  LV PW:         1.00 cm LV IVS:        0.90 cm LVOT diam:     2.00 cm LV SV:         73 LV SV Index:   35 LVOT Area:     3.14 cm  LV Volumes (MOD) LV vol d, MOD    59.8 ml A4C: LV vol s, MOD    46.2 ml A4C: LV SV MOD A4C:   59.8 ml  RIGHT VENTRICLE TAPSE (M-mode): 2.5 cm  LEFT ATRIUM             Index        RIGHT ATRIUM           Index LA Vol (A2C):   53.0 ml 25.72 ml/m  RA Area:     14.30 cm LA Vol (A4C):   36.0 ml 17.47 ml/m  RA Volume:   33.80 ml  16.40 ml/m LA Biplane Vol: 46.2 ml 22.42 ml/m AORTIC VALVE AV Area (Vmax):    2.51 cm AV Area (Vmean):   2.56 cm AV Area (VTI):     3.11 cm AV Vmax:           129.00 cm/s AV Vmean:          75.600 cm/s AV VTI:            0.234 m AV Peak Grad:      6.7 mmHg AV Mean Grad:      3.0 mmHg LVOT Vmax:         103.00 cm/s LVOT Vmean:        61.700 cm/s LVOT VTI:          0.232 m LVOT/AV VTI ratio: 0.99  AORTA Ao Root diam: 3.00 cm Ao Asc diam:  3.40 cm  MITRAL VALVE               TRICUSPID VALVE MV Area (PHT): 3.93 cm    TR Peak grad:   22.5 mmHg MV Decel Time: 193 msec    TR Vmax:        237.00 cm/s MR  Peak grad: 108.6 mmHg MR Vmax:      521.00 cm/s  SHUNTS MV E velocity: 57.00 cm/s  Systemic VTI:  0.23 m MV A velocity: 65.60 cm/s  Systemic Diam: 2.00 cm MV E/A ratio:  0.87  Chilton Si MD Electronically signed by Chilton Si MD Signature Date/Time: 08/11/2022/2:17:29 PM    Final              History of Present Illness:   Amy Wade is a 52 y.o. female with the above problem list.  [facility] on 07/26/22 by Dr. Flora Lipps for evaluation of CAD. Patient had reported several weeks of chest pain/pressure prior to this visit. Pain noted while sitting, improved with positional changes. Activity did not make symptoms worse. Although symptoms generally atypical, given risk factors including prior PCI in 2012, tobacco use, hypertension, hyperlipidemia, a stress test was planned. Patient was also given sublingual nitro Rx, as well as losartan  and Amlodipine . Patient then presented to the ED on 4/3 with ongoing atypical chest pain that awoke her from sleep. Troponins check, found negative at 8 and 7. ECG noted inferior lateral T wave inversions, stable with those seen in clinic (which were felt to be due to significant hypertension). Given accelerating symptoms, LHC completed. This found moderate disease (as above), felt to  be nonobstructive. She was ultimately discharged on ASA 81mg , Crestor 40mg , Imdur 60mg , Amlodipine 10mg , Crestor 6.25mg  BID, Losartan 50mg .  Today patient reports that since discharge, she has felt**     Past Medical History:  Diagnosis Date   Coronary artery disease    Hyperlipidemia    Hypertension    Myocardial infarction    Vaginal Pap smear, abnormal    Current Medications: No outpatient medications have been marked as taking for the 08/26/22 encounter (Appointment) with Ronney Asters, NP.    Allergies:   Ivp dye [iodinated contrast media]   Social History   Occupational History   Not on file  Tobacco Use   Smoking  status: Every Day    Packs/day: 0.50    Years: 20.00    Additional pack years: 0.00    Total pack years: 10.00    Types: Cigarettes   Smokeless tobacco: Not on file  Vaping Use   Vaping Use: Never used  Substance and Sexual Activity   Alcohol use: Yes   Drug use: No   Sexual activity: Yes    Birth control/protection: None    Family Hx: The patient's family history includes Heart disease in her father.  ROS   EKGs/Labs/Other Test Reviewed:    EKG:  EKG is *** ordered today.  The ekg ordered today demonstrates ***   Recent Labs: 08/10/2022: ALT 12; Hemoglobin 14.0; Platelets 452 08/12/2022: BUN 10; Creatinine, Ser 0.78; Magnesium 2.0; Potassium 3.8; Sodium 137   Recent Lipid Panel Recent Labs    07/01/22 0909  CHOL 215*  TRIG 128.0  HDL 45.80  VLDL 25.6  LDLCALC 144*      Risk Assessment/Calculations/Metrics:   {Does this patient have ATRIAL FIBRILLATION?:[phone]}     No BP recorded.  {Refresh Note OR Click here to enter BP  :1}***    Physical Exam:    VS:  LMP 07/26/2022     Wt Readings from Last 3 Encounters:  08/10/22 226 lb (102.5 kg)  08/10/22 224 lb (101.6 kg)  08/04/22 223 lb (101.2 kg)    Physical Exam ***     ASSESSMENT & PLAN:   No problem-specific Assessment & Plan notes found for this encounter.        {Are you ordering a CV Procedure (e.g. stress test, cath, DCCV, TEE, etc)?   Press F2        :[id number]}   Dispo:  No follow-ups on file.   Medication Adjustments/Labs and Tests Ordered: Current medicines are reviewed at length with the patient today.  Concerns regarding medicines are outlined above.  Tests Ordered: No orders of the defined types were placed in this encounter.  Medication Changes: No orders of the defined types were placed in this encounter.  Signed, Perlie Gold, PA-C  08/25/2022 9:18 PM    [facility] [address] [place], [place], [place]  [address] Phone: [phone]; Fax: [phone]

## 2022-08-25 NOTE — Assessment & Plan Note (Deleted)
Since discharge on 4/5, patient reports that her pain has been *. Continue medical management with:  ASA  Coreg 6.25mg  BID Imdur 

## 2022-08-25 NOTE — Assessment & Plan Note (Deleted)
BP is *. Continue Losartan , Crestor 6.25mg  BID, Imdur , Amlodipine 

## 2022-08-26 ENCOUNTER — Ambulatory Visit: Payer: 59 | Attending: General Practice | Admitting: General Practice

## 2022-08-26 DIAGNOSIS — I25119 Atherosclerotic heart disease of native coronary artery with unspecified angina pectoris: Secondary | ICD-10-CM

## 2022-08-26 DIAGNOSIS — I10 Essential (primary) hypertension: Secondary | ICD-10-CM

## 2022-08-30 ENCOUNTER — Encounter: Payer: Self-pay | Admitting: Family

## 2022-08-30 ENCOUNTER — Ambulatory Visit (INDEPENDENT_AMBULATORY_CARE_PROVIDER_SITE_OTHER): Payer: 59 | Admitting: Family

## 2022-08-30 ENCOUNTER — Ambulatory Visit: Payer: 59 | Admitting: Family

## 2022-08-30 VITALS — BP 122/80 | HR 95 | Ht 64.0 in | Wt 222.6 lb

## 2022-08-30 DIAGNOSIS — I25119 Atherosclerotic heart disease of native coronary artery with unspecified angina pectoris: Secondary | ICD-10-CM

## 2022-08-30 DIAGNOSIS — Z720 Tobacco use: Secondary | ICD-10-CM | POA: Diagnosis not present

## 2022-08-30 DIAGNOSIS — I10 Essential (primary) hypertension: Secondary | ICD-10-CM | POA: Diagnosis not present

## 2022-08-30 DIAGNOSIS — J019 Acute sinusitis, unspecified: Secondary | ICD-10-CM

## 2022-08-30 MED ORDER — AMOXICILLIN-POT CLAVULANATE 875-125 MG PO TABS: 1.0000 | ORAL_TABLET | Freq: Two times a day (BID) | ORAL | 0 refills | Status: AC

## 2022-08-30 MED ORDER — FLUTICASONE PROPIONATE 50 MCG/ACT NA SUSP: 2.0000 | Freq: Every day | NASAL | 6 refills | Status: DC

## 2022-08-30 MED ORDER — NITROGLYCERIN 0.4 MG SL SUBL: 0.4000 mg | SUBLINGUAL_TABLET | SUBLINGUAL | 1 refills | Status: DC | PRN

## 2022-08-30 NOTE — Patient Instructions (Addendum)
[phone]; please call to schedule a follow up with your cardiologist;   Please ask your cardiologist to clarify if they want you on Losartan 50 mg daily in addition to the 6 you are on;

## 2022-08-30 NOTE — Progress Notes (Signed)
Amy Wade is a 52 y.o. female with the following history as recorded in [facility]:  Patient Active Problem List   Diagnosis Date Noted   Chest pain 08/10/2022   Hypokalemia 08/10/2022   Hypocalcemia 08/10/2022   Obesity (BMI 30-39.9) 08/10/2022   History of MI (myocardial infarction) 05/22/2015   Coronary artery disease involving native coronary artery of native heart with angina pectoris 10/13/2012   Essential hypertension 10/13/2012   Tobacco use disorder 10/13/2012   Depressive disorder 10/13/2012   Hyperlipidemia 10/13/2012   Iron deficiency anemia 10/13/2012   Vitamin D deficiency 10/13/2012    Current Outpatient Medications  Medication Sig Dispense Refill   amLODipine (NORVASC) 10 MG tablet Take 1 tablet (10 mg total) by mouth daily. 30 tablet 2   amoxicillin-clavulanate (AUGMENTIN) 875-125 MG tablet Take 1 tablet by mouth 2 (two) times daily for 10 days. 20 tablet 0   aspirin EC 81 MG tablet Take 1 tablet (81 mg total) by mouth daily. Swallow whole. 30 tablet 12   carvedilol (COREG) 6.25 MG tablet Take 1 tablet (6.25 mg total) by mouth 2 (two) times daily with a meal. 180 tablet 3   fluticasone (FLONASE) 50 MCG/ACT nasal spray Place 2 sprays into both nostrils daily. 16 g 6   isosorbide mononitrate (IMDUR) 60 MG 24 hr tablet Take 1 tablet (60 mg total) by mouth daily. 30 tablet 2   pantoprazole (PROTONIX) 40 MG tablet Take 1 tablet (40 mg total) by mouth daily. 30 tablet 1   rosuvastatin (CRESTOR) 40 MG tablet Take 1 tablet (40 mg total) by mouth daily. 30 tablet 2   losartan (COZAAR) 50 MG tablet Take 1 tablet (50 mg total) by mouth daily. (Patient not taking: Reported on 08/30/2022) 90 tablet 3   nitroGLYCERIN (NITROSTAT) 0.4 MG SL tablet Place 1 tablet (0.4 mg total) under the tongue every 5 (five) minutes as needed for chest pain. 25 tablet 1   No current facility-administered medications for this visit.    Allergies: Ivp dye [iodinated contrast media]  Past Medical  History:  Diagnosis Date   Coronary artery disease    Hyperlipidemia    Hypertension    Myocardial infarction    Vaginal Pap smear, abnormal     Past Surgical History:  Procedure Laterality Date   CARDIAC CATHETERIZATION     ECTOPIC PREGNANCY SURGERY     LEFT HEART CATH AND CORONARY ANGIOGRAPHY N/A 08/11/2022   Procedure: LEFT HEART CATH AND CORONARY ANGIOGRAPHY;  Surgeon: Tonny Bollman, MD;  Location: [facility] INVASIVE CV LAB;  Service: Cardiovascular;  Laterality: N/A;    Family History  Problem Relation Age of Onset   Heart disease Father     Social History   Tobacco Use   Smoking status: Former    Packs/day: 0.50    Years: 20.00    Additional pack years: 0.00    Total pack years: 10.00    Types: Cigarettes   Smokeless tobacco: Not on file  Substance Use Topics   Alcohol use: Yes    Subjective:   Patient was hospitalized for atypical chest pain from April 3- August 12, 2022; symptoms were felt to be GI in nature; cardiac exam during hospitalization was reassuring; had to miss her cardiac hospital follow up last week due to work issues;  Also has been smoke free for 3 weeks; feeling much better except concerned that is now developing sinus infection;    Objective:  Vitals:   08/30/22 1329  BP: 122/80  Pulse: 95  SpO2: 97%  Weight: 222 lb 9.6 oz (101 kg)  Height:  (1.626 m)    General: Well developed, well nourished, in no acute distress  Skin : Warm and dry.  Head: Normocephalic and atraumatic  Eyes: Sclera and conjunctiva clear; pupils round and reactive to light; extraocular movements intact  Ears: External normal; canals clear; tympanic membranes mildly erythematous Oropharynx: Pink, supple. No suspicious lesions  Neck: Supple without thyromegaly, adenopathy  Lungs: Respirations unlabored; clear to auscultation bilaterally without wheeze, rales, rhonchi  CVS exam: normal rate and regular rhythm.  Neurologic: Alert and oriented; speech intact; face  symmetrical; moves all extremities well; CNII-XII intact without focal deficit   Assessment:  1. Essential hypertension   2. Coronary artery disease involving native coronary artery of native heart with angina pectoris   3. Tobacco abuse   4. Acute sinusitis, recurrence not specified, unspecified location     Plan:  Stable; patient is not currently taking Losartan that her cardiologist prescribed; blood pressure today is excellent- will hold and have her get this clarified by cardiology; does need to re-schedule her hospital follow up there and she agrees to do this; 3.   Congratulated patient on quitting smoking- she has been smoke free x 3 weeks;  4.   Rx for Augmentin and Flonase NS; follow up worse, no better.   No follow-ups on file.  No orders of the defined types were placed in this encounter.   Requested Prescriptions   Signed Prescriptions Disp Refills   nitroGLYCERIN (NITROSTAT) 0.4 MG SL tablet 25 tablet 1    Sig: Place 1 tablet (0.4 mg total) under the tongue every 5 (five) minutes as needed for chest pain.   amoxicillin-clavulanate (AUGMENTIN) 875-125 MG tablet 20 tablet 0    Sig: Take 1 tablet by mouth 2 (two) times daily for 10 days.   fluticasone (FLONASE) 50 MCG/ACT nasal spray 16 g 6    Sig: Place 2 sprays into both nostrils daily.

## 2022-09-02 ENCOUNTER — Ambulatory Visit: Payer: 59 | Admitting: Obstetrics & Gynecology

## 2022-09-02 DIAGNOSIS — D219 Benign neoplasm of connective and other soft tissue, unspecified: Secondary | ICD-10-CM | POA: Insufficient documentation

## 2022-09-07 NOTE — Progress Notes (Unsigned)
Cardiology Clinic Note   Date: 09/08/2022 ID: Amy Wade, DOB 1970/05/16, MRN [id number]  Primary Cardiologist:  Reatha Harps, MD  Patient Profile    Amy Wade is a 52 y.o. female who presents to the clinic today for follow-up after catheterization.  Past medical history significant for: CAD. Stents placed at [facility] 2011 (no records available).  Normal LHC 2016 normal (no records available). LHC 08/11/2022: Proximal RCA 30%.  Mid RCA 50%.  RPDA 50%.  OM2 50%.  Distal RCA 30%.  Mid LAD 50%.  No high-grade CAD.  Recommendations for management with aggressive medical therapy, lifestyle modification and continued tobacco cessation. Echo 08/11/2022: EF 50%.  Grade I DD.  Mild MR. Hypertension. Hyperlipidemia. Lipid panel 07/01/2022: LDL 144, HDL 46, TG 128, total 215. LPa 08/12/2022: 41.6. Tobacco abuse.   History of Present Illness    Amy Wade was first evaluated by Dr. Flora Lipps on 07/26/2022 for 2-week history of episodes of chest pressure at the request of Allyne Gee, FNP. Echo and cardiac PET CT. unfortunately, before she could undergo testing she presented to the ED on 08/10/2022 with complaints of left-sided chest pain radiating to her back with associated shortness of breath.  She reported taking NTG up to 4 times a day with immediate relief.  Troponin negative x 2.  EKG showed inferior lateral T wave inversions unchanged from prior EKG.  Echo showed low normal LV function and Grade I DD.  LHC showed no high-grade CAD (details above).  Patient was discharged on 08/12/2022.  Today, patient reports she was feeling great until about a week ago when she started developing some chest heaviness lasting 2 to 3 minutes and resolving with NTG or "walking it out."  Heaviness occurs at times when she is sitting or doing nonexertional activities.  She reports that sooner she feels that she takes a nitroglycerin.  However, she had an episode last night and did not have  her NTG with her so she did deep breathing and pacing for couple of minutes until heaviness subsided.  She reports a total of 6 as needed SL NTG over the last week.  Discussed possibility of an anxiety component to the chest heaviness.  Patient becomes tearful and expresses concern regarding her heart and "not wanting to have a heart attack."  Patient denies shortness of breath with episodes.  No DOE, exertional chest pain, orthopnea, PND.  Went over cath report with patient in detail.  Provided reassurance about current symptoms.  Upon further conversation she reports she was not having periods for greater than a year and just recently began monthly menstrual cycles.  She feels the pressure in her chest may be related to her cycles.  Patient has not had a cigarette for the last month.  She reports ports she was discharged on losartan but has not been taking it, as she does not have any at home so she was not sure if she was supposed to be taking it.  Patient has a free class to try Pilates coming up.  She is very excited and thinks that she will like it and be able to start going regularly.  Discussed starting a walking program.   ROS: All other systems reviewed and are otherwise negative except as noted in History of Present Illness.  Studies Reviewed    ECG personally reviewed by me today: NSR, 83 bpm LAFB.      Physical Exam    VS:  BP 120/70   Pulse  83   Ht 5\' 6"  (1.676 m)   Wt 223 lb 12.8 oz (101.5 kg)   BMI 36.12 kg/m  , BMI Body mass index is 36.12 kg/m.  GEN: Well nourished, well developed, in no acute distress. Neck: No JVD or carotid bruits. Cardiac:  RRR. No murmurs. No rubs or gallops.   Respiratory:  Respirations regular and unlabored. Clear to auscultation without rales, wheezing or rhonchi. GI: Soft, nontender, nondistended. Extremities: Radials/DP/PT 2+ and equal bilaterally. No clubbing or cyanosis. No edema  Skin: Warm and dry, no rash. Neuro: Strength  intact.  Assessment & Plan    CAD/atypical chest pain.  PCI with stents 2011 (records unavailable).  LHC April 2024 showed no high-grade CAD, recommended aggressive medical therapy and lifestyle modification.  Patient reports she was doing well until about a week ago when she developed some chest pressure which improves with NTG or "walking it out."  Episodes occur at rest or with nonexertional activities.  Discussed the possibility of an anxiety component to chest pressure and patient becomes very tearful.  Upon further conversation she feels this chest pressure may be related to menstrual cycles which started up a few months ago after not having a cycle for greater than a year.  She denies DOE, exertional chest pain, orthopnea, PND.  She has a free trial class for Pilates coming up and is looking forward to going regularly.  I feel chest pressure is anxiety mediated.  She will continue to monitor and contact the office if symptoms persist.  Continue aspirin, rosuvastatin, carvedilol, isosorbide, as needed SL NTG. Hypertension. BP today 120/70.  Patient denies headaches, dizziness or vision changes.  Patient has not been taking losartan secondary to not having any at home.  She was discharged on losartan 50 mg given current BP will have her start losartan 25 mg.  Continue carvedilol, Imdur. Hyperlipidemia.  LDL February 2024 144, not at goal.  Continue rosuvastatin.  Plan for repeat lipids next month. Tobacco abuse.  Patient has not had a cigarette for a month.  She is congratulated on her success and encouraged to continue complete smoking cessation.  Disposition: Losartan 25 mg daily.  Return in 3 months or sooner as needed.         Signed, Etta Grandchild. Zephaniah Enyeart, DNP, NP-C

## 2022-09-08 ENCOUNTER — Ambulatory Visit: Payer: 59 | Attending: Physician Assistant | Admitting: Student

## 2022-09-08 ENCOUNTER — Encounter: Payer: Self-pay | Admitting: Student

## 2022-09-08 VITALS — BP 120/70 | HR 83 | Ht 66.0 in | Wt 223.8 lb

## 2022-09-08 DIAGNOSIS — I2510 Atherosclerotic heart disease of native coronary artery without angina pectoris: Secondary | ICD-10-CM

## 2022-09-08 DIAGNOSIS — I10 Essential (primary) hypertension: Secondary | ICD-10-CM

## 2022-09-08 DIAGNOSIS — E785 Hyperlipidemia, unspecified: Secondary | ICD-10-CM

## 2022-09-08 DIAGNOSIS — R0789 Other chest pain: Secondary | ICD-10-CM

## 2022-09-08 DIAGNOSIS — Z720 Tobacco use: Secondary | ICD-10-CM

## 2022-09-08 MED ORDER — LOSARTAN POTASSIUM 25 MG PO TABS: 25.0000 mg | ORAL_TABLET | Freq: Every day | ORAL | 3 refills | Status: DC

## 2022-09-08 NOTE — Patient Instructions (Signed)
Medication Instructions:  Your physician has recommended you make the following change in your medication:  DECREASE: Losartan 25mg  daily  *If you need a refill on your cardiac medications before your next appointment, please call your pharmacy*   Lab Work: NONE If you have labs (blood work) drawn today and your tests are completely normal, you will receive your results only by: MyChart Message (if you have MyChart) OR A paper copy in the mail If you have any lab test that is abnormal or we need to change your treatment, we will call you to review the results.   Testing/Procedures: NONE   Follow-Up: At [facility], you and your health needs are our priority.  As part of our continuing mission to provide you with exceptional heart care, we have created designated Provider Care Teams.  These Care Teams include your primary Cardiologist (physician) and Advanced Practice Providers (APPs -  Physician Assistants and Nurse Practitioners) who all work together to provide you with the care you need, when you need it.  We recommend signing up for the patient portal called "MyChart".  Sign up information is provided on this After Visit Summary.  MyChart is used to connect with patients for Virtual Visits (Telemedicine).  Patients are able to view lab/test results, encounter notes, upcoming appointments, etc.  Non-urgent messages can be sent to your provider as well.   To learn more about what you can do with MyChart, go to [email or web address].    Your next appointment:   3 month(s)  Provider:   Reatha Harps, MD OR Carlos Levering, NP

## 2022-09-23 ENCOUNTER — Encounter: Payer: Self-pay | Admitting: Obstetrics and Gynecology

## 2022-09-23 ENCOUNTER — Encounter: Payer: Self-pay | Admitting: Family Medicine

## 2022-09-23 ENCOUNTER — Ambulatory Visit (INDEPENDENT_AMBULATORY_CARE_PROVIDER_SITE_OTHER): Payer: 59 | Admitting: Family Medicine

## 2022-09-23 ENCOUNTER — Ambulatory Visit (INDEPENDENT_AMBULATORY_CARE_PROVIDER_SITE_OTHER): Payer: 59 | Admitting: Obstetrics and Gynecology

## 2022-09-23 VITALS — BP 149/89 | HR 79 | Wt 216.0 lb

## 2022-09-23 VITALS — BP 149/89 | HR 72 | Ht 66.0 in | Wt 216.0 lb

## 2022-09-23 DIAGNOSIS — H6690 Otitis media, unspecified, unspecified ear: Secondary | ICD-10-CM

## 2022-09-23 DIAGNOSIS — R232 Flushing: Secondary | ICD-10-CM

## 2022-09-23 DIAGNOSIS — N950 Postmenopausal bleeding: Secondary | ICD-10-CM | POA: Diagnosis not present

## 2022-09-23 MED ORDER — CIPROFLOXACIN-DEXAMETHASONE 0.3-0.1 % OT SUSP
4.0000 [drp] | Freq: Two times a day (BID) | OTIC | 0 refills | Status: DC
Start: 2022-09-23 — End: 2022-12-02

## 2022-09-23 NOTE — Progress Notes (Unsigned)
    GYNECOLOGY VISIT  Patient name: Denessa Humphres MRN [id number]  Date of birth: [birth year] Chief Complaint:   No chief complaint on file.   History:  Leiha Monterosso is a 52 y.o. [phone] being seen today for ***.  Typiclaly has a cramping  Menses just stopped and was having night sweats (also notes not taking   Past Medical History:  Diagnosis Date   Coronary artery disease    Hyperlipidemia    Hypertension    Myocardial infarction (HCC)    Vaginal Pap smear, abnormal     Past Surgical History:  Procedure Laterality Date   CARDIAC CATHETERIZATION     ECTOPIC PREGNANCY SURGERY     LEFT HEART CATH AND CORONARY ANGIOGRAPHY N/A 08/11/2022   Procedure: LEFT HEART CATH AND CORONARY ANGIOGRAPHY;  Surgeon: Tonny Bollman, MD;  Location: [facility] INVASIVE CV LAB;  Service: Cardiovascular;  Laterality: N/A;    The following portions of the patient's history were reviewed and updated as appropriate: allergies, current medications, past family history, past medical history, past social history, past surgical history and problem list.   Health Maintenance:   Last pap ***. Results were: {Pap findings:25134}. H/O abnormal pap: {yes/yes***/no:23866} Last mammogram: ***. Results were: {normal, abnormal, n/a:23837}. Family h/o breast cancer: {yes***/no:23838}   Review of Systems:  {Ros - complete:30496} Comprehensive review of systems was otherwise negative.   Objective:  Physical Exam Wt 216 lb (98 kg)   BMI 34.86 kg/m    Physical Exam   Labs and Imaging No results found.     Assessment & Plan:   There are no diagnoses linked to this encounter.   *** Routine preventative health maintenance measures emphasized.  Lorriane Shire, MD [facility], [facility]

## 2022-09-23 NOTE — Progress Notes (Signed)
   Acute Office Visit  Subjective:     Patient ID: Amy Wade, female    DOB: 1970/11/04, 52 y.o.   MRN: [id number]  CC: right ear discomfort   HPI Patient is in today for right hear pain.   Patient has been having a few days of right ear pain/pressure starting to gradually worsen. States she did recently have a sinus infection and took Augmentin. No hearing loss, fevers, chills, body aches.     ROS All review of systems negative except what is listed in the HPI      Objective:    BP (!) 149/89 Comment: error in office, taken at GYN office 15 min prior  Pulse 72   Ht 5\' 6"  (1.676 m)   Wt 216 lb (98 kg)   SpO2 99%   BMI 34.86 kg/m    Physical Exam Vitals reviewed.  HENT:     Head: Normocephalic and atraumatic.     Left Ear: Tympanic membrane normal.     Ears:     Comments: Mildly erythematous right TM with bulging Skin:    General: Skin is warm and dry.  Neurological:     Mental Status: She is alert and oriented to person, place, and time.  Psychiatric:        Mood and Affect: Mood normal.        Behavior: Behavior normal.        Thought Content: Thought content normal.        Judgment: Judgment normal.     No results found for any visits on 09/23/22.      Assessment & Plan:   Problem List Items Addressed This Visit   None Visit Diagnoses     Acute otitis media, unspecified otitis media type    -  Primary   Relevant Medications   ciprofloxacin-dexamethasone (CIPRODEX) OTIC suspension     Start ciprodex drops for mild ear infection. Encouraged supportive measures. Follow-up if not improving.     Meds ordered this encounter  Medications   ciprofloxacin-dexamethasone (CIPRODEX) OTIC suspension    Sig: Place 4 drops into the right ear 2 (two) times daily.    Dispense:  7.5 mL    Refill:  0    Order Specific Question:   Supervising Provider    Answer:   Danise Edge A [[id number]]    Return if symptoms worsen or fail to improve.  Clayborne Dana, NP

## 2022-09-24 LAB — TSH RFX ON ABNORMAL TO FREE T4: TSH: 3.59 u[IU]/mL (ref 0.450–4.500)

## 2022-09-24 LAB — FOLLICLE STIMULATING HORMONE: FSH: 8.2 m[IU]/mL

## 2022-09-24 LAB — ESTRADIOL: Estradiol: 84.2 pg/mL

## 2022-09-27 ENCOUNTER — Ambulatory Visit (INDEPENDENT_AMBULATORY_CARE_PROVIDER_SITE_OTHER): Payer: 59 | Admitting: Family

## 2022-09-27 ENCOUNTER — Encounter: Payer: Self-pay | Admitting: Family

## 2022-09-27 VITALS — BP 136/78 | HR 85 | Ht 66.0 in | Wt 223.8 lb

## 2022-09-27 DIAGNOSIS — J209 Acute bronchitis, unspecified: Secondary | ICD-10-CM | POA: Diagnosis not present

## 2022-09-27 DIAGNOSIS — J019 Acute sinusitis, unspecified: Secondary | ICD-10-CM | POA: Diagnosis not present

## 2022-09-27 MED ORDER — PREDNISONE 20 MG PO TABS: 20.0000 mg | ORAL_TABLET | Freq: Every day | ORAL | 0 refills | Status: DC

## 2022-09-27 MED ORDER — AMOXICILLIN-POT CLAVULANATE 875-125 MG PO TABS: 1.0000 | ORAL_TABLET | Freq: Two times a day (BID) | ORAL | 0 refills | Status: AC

## 2022-09-27 NOTE — Patient Instructions (Signed)
Please schedule a follow up with the dentist as we discussed;

## 2022-09-27 NOTE — Progress Notes (Signed)
Amy Wade is a 52 y.o. 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 30 tablet 2   amoxicillin-clavulanate (AUGMENTIN) 875-125 MG tablet Take 1 tablet by mouth 2 (two) times daily for 10 days. 20 tablet 0   aspirin EC 81 MG tablet Take 1 tablet (81 mg total) by mouth daily. Swallow whole. 30 tablet 12   carvedilol (COREG) 6.25 MG tablet Take 1 tablet (6.25 mg total) by mouth 2 (two) times daily with a meal. 180 tablet 3   ciprofloxacin-dexamethasone (CIPRODEX) OTIC suspension Place 4 drops into the right ear 2 (two) times daily. 7.5 mL 0   fluticasone (FLONASE) 50 MCG/ACT nasal spray Place 2 sprays into both nostrils daily. (Patient taking differently: Place 2 sprays into both nostrils as needed.) 16 g 6   isosorbide mononitrate (IMDUR) 60 MG 24 hr tablet Take 1 tablet (60 mg total) by mouth daily. 30 tablet 2   losartan (COZAAR) 25 MG tablet Take 1 tablet (25 mg total) by mouth daily. 90 tablet 3   nitroGLYCERIN (NITROSTAT) 0.4 MG SL tablet Place 1 tablet (0.4 mg total) under the tongue every 5 (five) minutes as needed for chest pain. 25 tablet 1   pantoprazole (PROTONIX) 40 MG tablet Take 1 tablet (40 mg total) by mouth daily. 30 tablet 1   predniSONE (DELTASONE) 20 MG tablet Take 1  tablet (20 mg total) by mouth daily with breakfast. 5 tablet 0   rosuvastatin (CRESTOR) 40 MG tablet Take 1 tablet (40 mg total) by mouth daily. 30 tablet 2   No current facility-administered medications for this visit.    Allergies: Ivp dye [iodinated contrast media]  Past Medical History:  Diagnosis Date   Coronary artery disease    Hyperlipidemia    Hypertension    Myocardial infarction (HCC)    Vaginal Pap smear, abnormal     Past Surgical History:  Procedure Laterality Date   CARDIAC CATHETERIZATION     ECTOPIC PREGNANCY SURGERY     LEFT HEART CATH AND CORONARY ANGIOGRAPHY N/A 08/11/2022   Procedure: LEFT HEART CATH AND CORONARY ANGIOGRAPHY;  Surgeon: Tonny Bollman, MD;  Location: [facility] INVASIVE CV LAB;  Service: Cardiovascular;  Laterality: N/A;    Family History  Problem Relation Age of Onset   Heart disease Father     Social History   Tobacco Use   Smoking status: Former    Packs/day: 0.50    Years: 20.00    Additional pack years: 0.00    Total pack years: 10.00    Types: Cigarettes   Smokeless tobacco: Not on file  Substance Use Topics   Alcohol use: Yes    Subjective:   1 week history of right ear pain; was originally treated as acute otitis externa but no improvement with drops; overdue to see dentist; wheezing x  1 week;  Has been smoke free x 2 months;    Objective:  Vitals:   09/27/22 1541  BP: 136/78  Pulse: 85  SpO2: 99%  Weight: 223 lb 12.8 oz (101.5 kg)  Height: 5\' 6"  (1.676 m)    General: Well developed, well nourished, in no acute distress  Skin : Warm and dry.  Head: Normocephalic and atraumatic  Eyes: Sclera and conjunctiva clear; pupils round and reactive to light; extraocular movements intact  Ears: External normal; canals clear; tympanic membranes normal  Oropharynx: Pink, supple. No suspicious lesions  Neck: Supple without thyromegaly, adenopathy  Lungs: Respirations unlabored; wheezing noted in all 4 lobes CVS exam: normal rate and  regular rhythm.  Neurologic: Alert and oriented; speech intact; face symmetrical; moves all extremities well; CNII-XII intact without focal deficit   Assessment:  1. Acute sinusitis, recurrence not specified, unspecified location   2. Acute bronchitis, unspecified organism     Plan:  Rx for Augmentin 875 mg bid x 10 days, Prednisone 20 mg qd x 5 days; am suspicious for underlying dental source causing recurrent sinus issues; she will plan to see her dentist in 2 weeks for follow up.   No follow-ups on file.  No orders of the defined types were placed in this encounter.   Requested Prescriptions   Signed Prescriptions Disp Refills   amoxicillin-clavulanate (AUGMENTIN) 875-125 MG tablet 20 tablet 0    Sig: Take 1 tablet by mouth 2 (two) times daily for 10 days.   predniSONE (DELTASONE) 20 MG tablet 5 tablet 0    Sig: Take 1 tablet (20 mg total) by mouth daily with breakfast.

## 2022-10-05 ENCOUNTER — Encounter: Payer: Self-pay | Admitting: Family

## 2022-10-05 ENCOUNTER — Ambulatory Visit: Payer: 59 | Admitting: Gastroenterology

## 2022-10-27 ENCOUNTER — Ambulatory Visit: Payer: 59 | Admitting: Physician Assistant

## 2022-11-11 ENCOUNTER — Other Ambulatory Visit: Payer: Self-pay

## 2022-11-11 ENCOUNTER — Emergency Department (HOSPITAL_BASED_OUTPATIENT_CLINIC_OR_DEPARTMENT_OTHER): Payer: 59

## 2022-11-11 ENCOUNTER — Emergency Department (HOSPITAL_BASED_OUTPATIENT_CLINIC_OR_DEPARTMENT_OTHER)
Admission: EM | Admit: 2022-11-11 | Discharge: 2022-11-11 | Disposition: A | Discharge status: Home / Self Care | Payer: 59 | Attending: Emergency Medicine | Admitting: Emergency Medicine

## 2022-11-11 ENCOUNTER — Encounter (HOSPITAL_BASED_OUTPATIENT_CLINIC_OR_DEPARTMENT_OTHER): Payer: Self-pay

## 2022-11-11 DIAGNOSIS — Z79899 Other long term (current) drug therapy: Secondary | ICD-10-CM | POA: Diagnosis not present

## 2022-11-11 DIAGNOSIS — Z7982 Long term (current) use of aspirin: Secondary | ICD-10-CM | POA: Insufficient documentation

## 2022-11-11 DIAGNOSIS — I10 Essential (primary) hypertension: Secondary | ICD-10-CM | POA: Insufficient documentation

## 2022-11-11 DIAGNOSIS — I2510 Atherosclerotic heart disease of native coronary artery without angina pectoris: Secondary | ICD-10-CM | POA: Diagnosis not present

## 2022-11-11 DIAGNOSIS — R7309 Other abnormal glucose: Secondary | ICD-10-CM | POA: Diagnosis not present

## 2022-11-11 DIAGNOSIS — R0789 Other chest pain: Secondary | ICD-10-CM | POA: Insufficient documentation

## 2022-11-11 LAB — BASIC METABOLIC PANEL
Anion gap: 9 (ref 5–15)
BUN: 11 mg/dL (ref 6–20)
CO2: 25 mmol/L (ref 22–32)
Calcium: 8.9 mg/dL (ref 8.9–10.3)
Chloride: 103 mmol/L (ref 98–111)
Creatinine, Ser: 0.8 mg/dL (ref 0.44–1.00)
GFR, Estimated: >60 mL/min (ref >60)
Glucose, Bld: 100 mg/dL — ABNORMAL HIGH (ref 70–99)
Potassium: 3.5 mmol/L (ref 3.5–5.1)
Sodium: 137 mmol/L (ref 135–145)

## 2022-11-11 LAB — CBC
HCT: 42.3 % (ref 36.0–46.0)
Hemoglobin: 14 g/dL (ref 12.0–15.0)
MCH: 27.4 pg (ref 26.0–34.0)
MCHC: 33.1 g/dL (ref 30.0–36.0)
MCV: 82.8 fL (ref 80.0–100.0)
Platelets: 400 10*3/uL (ref 150–400)
RBC: 5.11 MIL/uL (ref 3.87–5.11)
RDW: 15.1 % (ref 11.5–15.5)
WBC: 10.3 10*3/uL (ref 4.0–10.5)
nRBC: 0 % (ref 0.0–0.2)

## 2022-11-11 LAB — TROPONIN I (HIGH SENSITIVITY)
Troponin I (High Sensitivity): 10 ng/L (ref <18)
Troponin I (High Sensitivity): 9 ng/L (ref <18)

## 2022-11-11 MED ORDER — SODIUM CHLORIDE 0.9 % IV SOLN: INTRAVENOUS | Status: DC | PRN

## 2022-11-11 MED ORDER — BACLOFEN 10 MG PO TABS: 10.0000 mg | ORAL_TABLET | Freq: Three times a day (TID) | ORAL | 0 refills | Status: DC | PRN

## 2022-11-11 MED ORDER — ONDANSETRON HCL 4 MG/2ML IJ SOLN
4.0000 mg | Freq: Once | INTRAMUSCULAR | Status: AC
  Administered 2022-11-11: 4 mg via INTRAVENOUS
  Filled 2022-11-11: qty 2

## 2022-11-11 MED ORDER — FENTANYL CITRATE PF 50 MCG/ML IJ SOSY
50.0000 ug | PREFILLED_SYRINGE | Freq: Once | INTRAMUSCULAR | Status: AC
  Administered 2022-11-11: 50 ug via INTRAVENOUS
  Filled 2022-11-11: qty 1

## 2022-11-11 MED ORDER — FAMOTIDINE IN NACL 20-0.9 MG/50ML-% IV SOLN
20.0000 mg | Freq: Once | INTRAVENOUS | Status: AC
  Administered 2022-11-11: 20 mg via INTRAVENOUS
  Filled 2022-11-11: qty 50

## 2022-11-11 MED ORDER — HYOSCYAMINE SULFATE 0.125 MG SL SUBL
0.1250 mg | SUBLINGUAL_TABLET | Freq: Once | SUBLINGUAL | Status: AC
  Administered 2022-11-11: 0.125 mg via SUBLINGUAL
  Filled 2022-11-11: qty 1

## 2022-11-11 MED ORDER — MELOXICAM 7.5 MG PO TABS: 7.5000 mg | ORAL_TABLET | Freq: Every day | ORAL | 0 refills | Status: DC

## 2022-11-11 NOTE — ED Triage Notes (Signed)
Pt states that she has been having chest pain for 2 days that is getting worse. States that the pain takes her breath away.

## 2022-11-11 NOTE — Discharge Instructions (Addendum)

## 2022-11-11 NOTE — ED Provider Notes (Signed)
[place] [facility] Provider Note   CSN: [id number] Arrival date & time: 11/11/22  9563     History  Chief Complaint  Patient presents with   Chest Pain    Amy Wade is a 52 y.o. female who presents w cc of CP onset x 2 days.  has a past medical history of Coronary artery disease, Hyperlipidemia, Hypertension, Myocardial infarction (HCC). Most recent heart cath 4/24 showed nonobstructive CAD. C/o pain that " feels like my last heart attack."  Patient reports intermittent left-sided chest pain radiating to her neck and left arm lasting approximately 2 minutes at a time.  She describes the pain as sharp and pressure-like. she has been taking Tylenol without relief.  It is not excess associated with exertion.  Nothing seems to make it worse or better.  She denies nausea or vomiting.  When the pain comes on it makes her feel short of breath due to pain.   She denies reflux symptoms belching.    Chest Pain       Home Medications Prior to Admission medications   Medication Sig Start Date End Date Taking? Authorizing Provider  baclofen (LIORESAL) 10 MG tablet Take 1 tablet (10 mg total) by mouth 3 (three) times daily as needed for muscle spasms. 11/11/22  Yes Edison Wollschlager, PA-C  amLODipine (NORVASC) 10 MG tablet Take 1 tablet (10 mg total) by mouth daily. 08/12/22   Kathlen Mody, MD  aspirin EC 81 MG tablet Take 1 tablet (81 mg total) by mouth daily. Swallow whole. 08/12/22   Kathlen Mody, MD  carvedilol (COREG) 6.25 MG tablet Take 1 tablet (6.25 mg total) by mouth 2 (two) times daily with a meal. 08/12/22   Jonita Albee, PA-C  ciprofloxacin-dexamethasone (CIPRODEX) OTIC suspension Place 4 drops into the right ear 2 (two) times daily. 09/23/22   Clayborne Dana, NP  fluticasone (FLONASE) 50 MCG/ACT nasal spray Place 2 sprays into both nostrils daily. Patient taking differently: Place 2 sprays into both nostrils as needed. 08/30/22   Olive Bass, FNP  isosorbide mononitrate (IMDUR) 60 MG 24 hr tablet Take 1 tablet (60 mg total) by mouth daily. 08/12/22   Kathlen Mody, MD  losartan (COZAAR) 25 MG tablet Take 1 tablet (25 mg total) by mouth daily. 09/08/22   Carlos Levering, NP  meloxicam (MOBIC) 7.5 MG tablet Take 1 tablet (7.5 mg total) by mouth daily. 11/11/22   Arthor Captain, PA-C  nitroGLYCERIN (NITROSTAT) 0.4 MG SL tablet Place 1 tablet (0.4 mg total) under the tongue every 5 (five) minutes as needed for chest pain. 08/30/22 11/28/22  Olive Bass, FNP  pantoprazole (PROTONIX) 40 MG tablet Take 1 tablet (40 mg total) by mouth daily. 08/12/22 10/11/22  Kathlen Mody, MD  predniSONE (DELTASONE) 20 MG tablet Take 1 tablet (20 mg total) by mouth daily with breakfast. 09/27/22   Olive Bass, FNP  rosuvastatin (CRESTOR) 40 MG tablet Take 1 tablet (40 mg total) by mouth daily. 08/12/22   Kathlen Mody, MD      Allergies    Ivp dye [iodinated contrast media]    Review of Systems   Review of Systems  Cardiovascular:  Positive for chest pain.    Physical Exam Updated Vital Signs BP (!) 154/95   Pulse (!) 56   Temp 98 F (36.7 C) (Oral)   Resp 13   Ht 5\' 3"  (1.6 m)   Wt 100.2 kg   SpO2 99%  BMI 39.15 kg/m  Physical Exam Vitals and nursing note reviewed.  Constitutional:      General: She is not in acute distress.    Appearance: She is well-developed. She is not diaphoretic.  HENT:     Head: Normocephalic and atraumatic.     Right Ear: External ear normal.     Left Ear: External ear normal.     Nose: Nose normal.     Mouth/Throat:     Mouth: Mucous membranes are moist.  Eyes:     General: No scleral icterus.    Conjunctiva/sclera: Conjunctivae normal.  Cardiovascular:     Rate and Rhythm: Normal rate and regular rhythm.     Heart sounds: Normal heart sounds. No murmur heard.    No friction rub. No gallop.  Pulmonary:     Effort: Pulmonary effort is normal. No respiratory distress.      Breath sounds: Normal breath sounds.  Chest:     Chest wall: No tenderness.  Abdominal:     General: Bowel sounds are normal. There is no distension.     Palpations: Abdomen is soft. There is no mass.     Tenderness: There is no abdominal tenderness. There is no guarding.  Musculoskeletal:     Cervical back: Normal range of motion.  Skin:    General: Skin is warm and dry.  Neurological:     Mental Status: She is alert and oriented to person, place, and time.  Psychiatric:        Behavior: Behavior normal.     ED Results / Procedures / Treatments   Labs (all labs ordered are listed, but only abnormal results are displayed) Labs Reviewed  BASIC METABOLIC PANEL - Abnormal; Notable for the following components:      Result Value   Glucose, Bld 100 (*)    All other components within normal limits  CBC  PREGNANCY, URINE  TROPONIN I (HIGH SENSITIVITY)  TROPONIN I (HIGH SENSITIVITY)    EKG EKG Interpretation Date/Time:  Friday November 11 2022 10:08:11 EDT Ventricular Rate:  72 PR Interval:  154 QRS Duration:  98 QT Interval:  431 QTC Calculation: 472 R Axis:   66  Text Interpretation: Sinus rhythm Consider right atrial enlargement Inferior infarct, age indeterminate Anterior infarct, old Lateral leads are also involved inferior changes more prominant than prior 4/24 Confirmed by Meridee Score [phone]) on 11/11/2022 10:13:31 AM  Radiology DG Chest 2 View  Result Date: 11/11/2022 CLINICAL DATA:  Chest pain EXAM: CHEST - 2 VIEW COMPARISON:  X-ray 08/10/2022 FINDINGS: The heart size and mediastinal contours are within normal limits. Both lungs are clear. No consolidation, pneumothorax, effusion or edema. The visualized skeletal structures are unremarkable. Overlapping cardiac leads. IMPRESSION: No acute cardiopulmonary disease Electronically Signed   By: Karen Kays M.D.   On: 11/11/2022 10:52    Procedures Procedures    Medications Ordered in ED Medications  0.9 %  sodium  chloride infusion (0 mLs Intravenous Stopped 11/11/22 1236)  fentaNYL (SUBLIMAZE) injection 50 mcg (50 mcg Intravenous Given 11/11/22 1044)  ondansetron (ZOFRAN) injection 4 mg (4 mg Intravenous Given 11/11/22 1045)  famotidine (PEPCID) IVPB 20 mg premix (0 mg Intravenous Stopped 11/11/22 1236)  hyoscyamine (LEVSIN SL) SL tablet 0.125 mg (0.125 mg Sublingual Given 11/11/22 1043)    ED Course/ Medical Decision Making/ A&P Clinical Course as of 11/11/22 1407  Fri Nov 11, 2022  1007 Review of EMR shows heart cath from 4/24 had "no high-grade coronary artery disease.  With atypical angina, normal cardiac biomarkers, and no critical stenoses, favor management with aggressive medical therapy, lifestyle modification, and continued tobacco cessation."     [AH]  1124 Basic metabolic panel(!) [AH]  1124 Glucose(!): 100 [AH]  1124 Troponin I (High Sensitivity) [AH]  1124 CBC [AH]  1124 Labs are reassuring [AH]  1124 DG Chest 2 View I personally visualized and interpreted the images using our PACS system. Acute findings include:  No acute findings on chest x-ray  [AH]  1125 EKG 12-Lead NSR- old ant MI. [AH]  1405 Troponin I (High Sensitivity) Negative x 2 [AH]  1405 Basic metabolic panel(!) [AH]    Clinical Course User Index [AH] Arthor Captain, PA-C             HEART Score: 4                Medical Decision Making Amount and/or Complexity of Data Reviewed Labs: ordered. Decision-making details documented in ED Course. Radiology: ordered. Decision-making details documented in ED Course. ECG/medicine tests:  Decision-making details documented in ED Course.  Risk Prescription drug management.   Given the large differential diagnosis for Tamah Pagliuca, the decision making in this case is of high complexity.  After evaluating all of the data points in this case, the presentation of Elliotte Celaya is NOT consistent with Acute Coronary Syndrome (ACS) and/or myocardial ischemia, pulmonary  embolism, aortic dissection; Borhaave's, significant arrythmia, pneumothorax, cardiac tamponade, or other emergent cardiopulmonary condition.  Further, the presentation of Vernisha Kage is NOT consistent with pericarditis, myocarditis, cholecystitis, pancreatitis, mediastinitis, endocarditis, new valvular disease.  Additionally, the presentation of Elliett Zimmermanis NOT consistent with flail chest, cardiac contusion, ARDS, or significant intra-thoracic or intra-abdominal bleeding.  Moreover, this presentation is NOT consistent with pneumonia, sepsis, or pyelonephritis.   Strict return and follow-up precautions have been given by me personally or by detailed written instruction given verbally by nursing staff using the teach back method to the patient/family/caregiver(s).  Data Reviewed/Counseling: I have reviewed the patient's vital signs, nursing notes, and other relevant tests/information. I had a detailed discussion regarding the historical points, exam findings, and any diagnostic results supporting the discharge diagnosis. I also discussed the need for outpatient follow-up and the need to return to the ED if symptoms worsen or if there are any questions or concerns that arise at home.         Final Clinical Impression(s) / ED Diagnoses Final diagnoses:  Atypical chest pain    Rx / DC Orders ED Discharge Orders          Ordered    meloxicam (MOBIC) 7.5 MG tablet  Daily,   Status:  Discontinued        11/11/22 1402    baclofen (LIORESAL) 10 MG tablet  3 times daily PRN        11/11/22 1402    meloxicam (MOBIC) 7.5 MG tablet  Daily        11/11/22 1404              Arthor Captain, PA-C 11/11/22 1408    Terrilee Files, MD 11/12/22 1013

## 2022-11-15 ENCOUNTER — Other Ambulatory Visit (HOSPITAL_COMMUNITY): Payer: 59

## 2022-11-28 ENCOUNTER — Telehealth: Payer: Self-pay

## 2022-11-28 NOTE — Transitions of Care (Post Inpatient/ED Visit) (Unsigned)
   11/28/2022  Name: Amy Wade MRN: [id number] DOB: 03-Feb-1971  Today's TOC FU Call Status: Today's TOC FU Call Status:: Unsuccessul Call (1st Attempt) Unsuccessful Call (1st Attempt) Date: 11/28/22  Attempted to reach the patient regarding the most recent Inpatient/ED visit.  Follow Up Plan: Additional outreach attempts will be made to reach the patient to complete the Transitions of Care (Post Inpatient/ED visit) call.   Signature Karena Addison, LPN [facility] Nurse Health Advisor Direct Dial [phone]

## 2022-11-29 NOTE — Transitions of Care (Post Inpatient/ED Visit) (Signed)
11/29/2022  Name: Amy Wade MRN: [id number] DOB: [birth year]  Today's TOC FU Call Status: Today's TOC FU Call Status:: Successful TOC FU Call Competed Unsuccessful Call (1st Attempt) Date: 11/28/22 [facility] FU Call Complete Date: 11/29/22  Transition Care Management Follow-up Telephone Call Date of Discharge: 11/27/22 Discharge Facility: [facility] Type of Discharge: Inpatient Admission Primary Inpatient Discharge Diagnosis:: STEMI How have you been since you were released from the hospital?: Better Any questions or concerns?: No  Items Reviewed: Did you receive and understand the discharge instructions provided?: Yes Medications obtained,verified, and reconciled?: Yes (Medications Reviewed) Any new allergies since your discharge?: No Dietary orders reviewed?: Yes Do you have support at home?: Yes  Medications Reviewed Today: Medications Reviewed Today     Reviewed by Karena Addison, LPN (Licensed Practical Nurse) on 11/29/22 at 1701  Med List Status: <None>   Medication Order Taking? Sig Documenting Provider Last Dose Status Informant  amLODipine (NORVASC) 10 MG tablet [id number] Yes Take 1 tablet (10 mg total) by mouth daily. Kathlen Mody, MD Taking Active   aspirin EC 81 MG tablet [id number] Yes Take 1 tablet (81 mg total) by mouth daily. Swallow whole. Kathlen Mody, MD Taking Active   baclofen (LIORESAL) 10 MG tablet [id number] Yes Take 1 tablet (10 mg total) by mouth 3 (three) times daily as needed for muscle spasms. Arthor Captain, PA-C Taking Active   carvedilol (COREG) 6.25 MG tablet [id number] Yes Take 1 tablet (6.25 mg total) by mouth 2 (two) times daily with a meal. Jonita Albee, PA-C Taking Active   ciprofloxacin-dexamethasone (CIPRODEX) OTIC suspension [id number] No Place 4 drops into the right ear 2 (two) times daily.  Patient not taking: Reported on 11/29/2022   Clayborne Dana, NP Not Taking Active   fluticasone [facility]) 50 MCG/ACT nasal spray  [id number] Yes Place 2 sprays into both nostrils daily.  Patient taking differently: Place 2 sprays into both nostrils as needed.   Olive Bass, FNP Taking Active   isosorbide mononitrate (IMDUR) 60 MG 24 hr tablet [id number] Yes Take 1 tablet (60 mg total) by mouth daily. Kathlen Mody, MD Taking Active   losartan (COZAAR) 25 MG tablet [id number] Yes Take 1 tablet (25 mg total) by mouth daily. Carlos Levering, NP Taking Active   meloxicam (MOBIC) 7.5 MG tablet [id number] Yes Take 1 tablet (7.5 mg total) by mouth daily. Arthor Captain, PA-C Taking Active   nitroGLYCERIN (NITROSTAT) 0.4 MG SL tablet [id number]  Place 1 tablet (0.4 mg total) under the tongue every 5 (five) minutes as needed for chest pain. Olive Bass, FNP  Expired 11/28/22 2359   pantoprazole (PROTONIX) 40 MG tablet [id number]  Take 1 tablet (40 mg total) by mouth daily. Kathlen Mody, MD  Expired 10/11/22 2359   predniSONE (DELTASONE) 20 MG tablet [id number] No Take 1 tablet (20 mg total) by mouth daily with breakfast.  Patient not taking: Reported on 11/29/2022   Olive Bass, FNP Not Taking Active   rosuvastatin (CRESTOR) 40 MG tablet [id number] No Take 1 tablet (40 mg total) by mouth daily.  Patient not taking: Reported on 11/29/2022   Kathlen Mody, MD Not Taking Active   ticagrelor (BRILINTA) 90 MG TABS tablet [id number] Yes Take 90 mg by mouth 2 (two) times daily. [provider] Taking Active             Home Care and Equipment/Supplies: Were Home Health Services Ordered?: NA Any new equipment or medical supplies ordered?: NA  Functional Questionnaire: Do  you need assistance with bathing/showering or dressing?: No Do you need assistance with meal preparation?: No Do you need assistance with eating?: No Do you have difficulty maintaining continence: No Do you need assistance with getting out of bed/getting out of a chair/moving?: No Do you have difficulty managing or taking  your medications?: No  Follow up appointments reviewed: PCP Follow-up appointment confirmed?: Yes Date of PCP follow-up appointment?: 12/02/22 Follow-up Provider: [facility] Follow-up appointment confirmed?: Yes Date of Specialist follow-up appointment?: 12/05/22 Follow-Up Specialty Provider:: cardio Do you need transportation to your follow-up appointment?: No Do you understand care options if your condition(s) worsen?: Yes-patient verbalized understanding    SIGNATURE Karena Addison, LPN [facility] Nurse Health Advisor Direct Dial [phone]

## 2022-11-29 NOTE — Progress Notes (Signed)
Cardiology Office Note:  .   Date:  12/05/2022  ID:  Amy Wade, DOB [birth year], MRN [id number] PCP: Olive Bass, FNP  [facility] Providers Cardiologist:  Reatha Harps, MD    Patient Profile: .      PMH: Chest pain CAD Stents at [facility] 2011 (no records available) Normal LHC 2016 (no records available) [facility] 08/11/2022 Proximal RCA 30%, mid RCA 50%, RPDA 50%, OM2 50%, distal RCA 30%, mid LAD 50%, no high-grade CAD.  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 Flora Lipps on 07/26/2022 for 2-week history of chest pressure.  Echo and cardiac PET CT were ordered, unfortunately prior to testing she presented to ED on 08/10/2022 with complaints of left-sided chest pain radiating to her back with associated shortness of breath.  She was taking NTG up to 4 times daily with immediate relief.  Troponin negative x 2.  EKG showed inferior lateral T wave inversions unchanged from prior EKG.  Echo showed low normal LV function and grade 1 diastolic dysfunction.  LHC showed no high-grade CAD (details above).  Seen in follow-up by Carlos Levering, NP on 09/08/2022 at which time she reported feeling great until a week prior when she started developing chest heaviness lasting 2 to 3 minutes and resolving with NTG or "walking it out."  Heaviness occurs at times when sitting or doing on exertional activities.  She reported a total of 6 SL NTG over the previous week.  On one occasion she did not have NTG and symptoms resolved with deep breathing and pacing.  She reported no cigarettes for 1 month.  Component of anxiety was discussed and patient became  tearful, "I do not want to have a heart attack."  Cath report was reviewed in detail.  She also reported she had resumed monthly menstrual cycles which had been absent for the previous year.  Felt symptoms may be related to her cycle.  She was encouraged to start losartan 25 mg daily (previously prescribed 50 mg but not started) and continue carvedilol, asa, rosuvastatin, and Imdur. LDL 144 in February, plan to repeat on higher dose rosuvastatin.   Admission 7/19-7/20/24 with STEMI with 90% stenosis mRCA, s/p successful PCI/DES with 3.0 x 28 mm Xience DES, elevated LVEDP, systemic hypertension.         History of Present Illness: .   Amy Wade is a pleasant 52 y.o. female who is here today for follow-up of STEMI. She is accompanied by a family member. Reports she is feeling ok. She notes that she is breathing hard at times.  She denies chest pain, dyspnea on exertion, palpitations, orthopnea, PND, edema.  Has not returned to work. 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  She has not been exercising, wanted to wait to see [place] to start.  She is interested in cardiac rehab in [facility].  ROS: See HPI       Studies Reviewed: .         Risk Assessment/Calculations:             Physical Exam:   VS:  BP 116/70   Pulse 67   Ht 5\' 3"  (1.6 m)   Wt  224 lb 12.8 oz (102 kg)   SpO2 99%   BMI 39.82 kg/m    Wt Readings from Last 3 Encounters:  12/05/22 224 lb 12.8 oz (102 kg)  12/02/22 223 lb 6.4 oz (101.3 kg)  11/11/22 221 lb (100.2 kg)    GEN: Well nourished, well developed in no acute distress NECK: No JVD; No carotid bruits CARDIAC: RRR, no murmurs, rubs, gallops RESPIRATORY:  Clear to auscultation without rales, wheezing or rhonchi  ABDOMEN: Soft, non-tender, non-distended EXTREMITIES:  No edema; No deformity     ASSESSMENT AND PLAN: .    CAD without angina: Prior stent in 2011, no records available. LHC 08/11/22 with no high grade CAD.  She presented 11/25/2022 to [facility] with STEMI and underwent cardiac catheterization which revealed mid RCA lesion 90% stenosis successfully treated with PCI/DES x 1.  Additionally, mild nonobstructive CAD.  She was discharged on aspirin 81 mg daily, Brilinta 90 mg twice daily, atorvastatin 80 mg daily, carvedilol 25 mg twice daily, losartan 100 mg daily, amlodipine 10 mg daily.  Was advised to start Imdur, however did not get this Rx. She denies chest pain, dyspnea, or other symptoms concerning for angina. No indication for further ischemic evaluation at this time. She would like to participate in cardiac rehab at [facility]. I will send request to Dr. Flora Lipps, primary cardiologist, for orders.  We will have her start Imdur 30 mg daily.  Will reduce amlodipine to 5 mg daily due to soft BP. I encouraged her to start walking for exercise.   Hyperlipidemia LDL goal < 55: LDL 151 on 08/16/79.  She was changed from rosuvastatin to atorvastatin 80 mg daily during hospital admission.  We will recheck lipid panel in 8 weeks.  Hypertension: BP is well-controlled.  She was advised to start Imdur 30 mg daily at hospital discharge, however this prescription was never sent.  Due to soft BP, we will reduce amlodipine to 5 mg daily and start Imdur 30 mg daily. I have asked her to get a home BP cuff and to notify us if she has concerns about orthostasis.        Dispo: 3-4 months with Dr. Flora Lipps  Signed, Eligha Bridegroom, NP-C

## 2022-11-30 ENCOUNTER — Other Ambulatory Visit: Payer: Self-pay | Admitting: Family

## 2022-11-30 MED ORDER — NITROGLYCERIN 0.4 MG SL SUBL: 0.4000 mg | SUBLINGUAL_TABLET | SUBLINGUAL | 1 refills | Status: AC | PRN

## 2022-12-02 ENCOUNTER — Ambulatory Visit (INDEPENDENT_AMBULATORY_CARE_PROVIDER_SITE_OTHER): Payer: 59 | Admitting: Family

## 2022-12-02 ENCOUNTER — Encounter: Payer: Self-pay | Admitting: Family

## 2022-12-02 VITALS — BP 118/70 | HR 84 | Ht 63.0 in | Wt 223.4 lb

## 2022-12-02 DIAGNOSIS — I213 ST elevation (STEMI) myocardial infarction of unspecified site: Secondary | ICD-10-CM | POA: Diagnosis not present

## 2022-12-02 DIAGNOSIS — I25119 Atherosclerotic heart disease of native coronary artery with unspecified angina pectoris: Secondary | ICD-10-CM | POA: Diagnosis not present

## 2022-12-02 DIAGNOSIS — I10 Essential (primary) hypertension: Secondary | ICD-10-CM | POA: Diagnosis not present

## 2022-12-02 MED ORDER — PANTOPRAZOLE SODIUM 40 MG PO TBEC: 40.0000 mg | DELAYED_RELEASE_TABLET | Freq: Every day | ORAL | 3 refills | Status: AC

## 2022-12-02 MED ORDER — CARVEDILOL 25 MG PO TABS: 25.0000 mg | ORAL_TABLET | Freq: Two times a day (BID) | ORAL | Status: AC

## 2022-12-02 MED ORDER — LOSARTAN POTASSIUM 100 MG PO TABS: 100.0000 mg | ORAL_TABLET | Freq: Every day | ORAL | Status: DC

## 2022-12-02 MED ORDER — LOSARTAN POTASSIUM 25 MG PO TABS: 100.0000 mg | ORAL_TABLET | Freq: Every day | ORAL | Status: DC

## 2022-12-02 NOTE — Progress Notes (Signed)
Amy Wade is a 52 y.o. 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 30 tablet 2   aspirin EC 81 MG tablet Take 1 tablet (81 mg total) by mouth daily. Swallow whole. 30 tablet 12   atorvastatin (LIPITOR) 80 MG tablet Take 80 mg by mouth daily.     isosorbide mononitrate (IMDUR) 60 MG 24 hr tablet Take 1 tablet (60 mg total) by mouth daily. 30 tablet 2   losartan (COZAAR) 100 MG tablet Take 100 mg by mouth daily.     nitroGLYCERIN (NITROSTAT) 0.4 MG SL tablet Place 1 tablet (0.4 mg total) under the tongue every 5 (five) minutes x 3 doses as needed for chest pain. 25 tablet 1   ticagrelor (BRILINTA) 90 MG TABS tablet Take 90 mg by mouth 2 (two) times daily.     carvedilol (COREG) 25 MG tablet Take 1 tablet (25 mg total) by mouth 2 (two) times daily with a meal.     pantoprazole (PROTONIX) 40 MG tablet Take 1 tablet (40 mg total) by mouth daily. 90 tablet 3   No current facility-administered medications for this visit.    Allergies: Ivp dye [iodinated contrast media]  Past Medical History:  Diagnosis Date   Coronary artery disease    Hyperlipidemia    Hypertension    Myocardial infarction (HCC)    Vaginal Pap smear, abnormal     Past Surgical History:  Procedure Laterality  Date   CARDIAC CATHETERIZATION     ECTOPIC PREGNANCY SURGERY     LEFT HEART CATH AND CORONARY ANGIOGRAPHY N/A 08/11/2022   Procedure: LEFT HEART CATH AND CORONARY ANGIOGRAPHY;  Surgeon: Tonny Bollman, MD;  Location: [facility] INVASIVE CV LAB;  Service: Cardiovascular;  Laterality: N/A;    Family History  Problem Relation Age of Onset   Heart disease Father     Social History   Tobacco Use   Smoking status: Former    Current packs/day: 0.50    Average packs/day: 0.5 packs/day for 20.0 years (10.0 ttl pk-yrs)    Types: Cigarettes   Smokeless tobacco: Not on file  Substance Use Topics   Alcohol use: Yes    Subjective:   Accompanied by husband; admitted on 11/25/22 with STEMI; found to have severe stenosis in mid right coronary artery- treated with drug eluting stent; is scheduled to see her cardiologist for follow up on Monday; notes that medication changes were made- requesting updated medication list; No acute chest pain/ still having some fatigue; has remained smoke free since earlier this year;   Objective:  Vitals:   12/02/22 1417  BP: 118/70  Pulse: 84  SpO2: 97%  Weight: 223 lb 6.4 oz (101.3 kg)  Height: 5\' 3"  (1.6 m)    General: Well developed, well nourished, in no acute distress  Skin : Warm and  dry.  Head: Normocephalic and atraumatic  Lungs: Respirations unlabored; clear to auscultation bilaterally without wheeze, rales, rhonchi  CVS exam: normal rate and regular rhythm.  Neurologic: Alert and oriented; speech intact; face symmetrical; moves all extremities well; CNII-XII intact without focal deficit   Assessment:  1. ST elevation myocardial infarction (STEMI), unspecified artery (HCC)   2. Essential hypertension   3. Coronary artery disease involving native coronary artery of native heart with angina pectoris (HCC)     Plan:  Medication review is done with patient- she is questioning the Rx for Imdur; explained she will need to get this clarified with cardiology; per  patient, she does not think she was taking this prior to MI and is unsure what this medication is;  Updated dosages of Amlodipine, Coreg and Losartan; d/c Crestor- change to Atorvastatin;  She will need to talk to her cardiologist about FMLA needs/ cardiac rehab as they will be the ones primarily managing her needs at this time.   Time spent 30 minutes  No follow-ups on file.  No orders of the defined types were placed in this encounter.   Requested Prescriptions   Signed Prescriptions Disp Refills   carvedilol (COREG) 25 MG tablet      Sig: Take 1 tablet (25 mg total) by mouth 2 (two) times daily with a meal.   pantoprazole (PROTONIX) 40 MG tablet 90 tablet 3    Sig: Take 1 tablet (40 mg total) by mouth daily.

## 2022-12-05 ENCOUNTER — Ambulatory Visit: Payer: 59 | Attending: Nurse Practitioner | Admitting: Nurse Practitioner

## 2022-12-05 ENCOUNTER — Encounter: Payer: Self-pay | Admitting: Nurse Practitioner

## 2022-12-05 ENCOUNTER — Other Ambulatory Visit: Payer: Self-pay | Admitting: *Deleted

## 2022-12-05 VITALS — BP 116/70 | HR 67 | Ht 63.0 in | Wt 224.8 lb

## 2022-12-05 DIAGNOSIS — E785 Hyperlipidemia, unspecified: Secondary | ICD-10-CM

## 2022-12-05 DIAGNOSIS — R0789 Other chest pain: Secondary | ICD-10-CM | POA: Diagnosis not present

## 2022-12-05 DIAGNOSIS — I2510 Atherosclerotic heart disease of native coronary artery without angina pectoris: Secondary | ICD-10-CM | POA: Diagnosis not present

## 2022-12-05 DIAGNOSIS — I10 Essential (primary) hypertension: Secondary | ICD-10-CM | POA: Diagnosis not present

## 2022-12-05 MED ORDER — ISOSORBIDE MONONITRATE ER 30 MG PO TB24: 30.0000 mg | ORAL_TABLET | Freq: Every day | ORAL | 3 refills | Status: AC

## 2022-12-05 MED ORDER — AMLODIPINE BESYLATE 10 MG PO TABS: 5.0000 mg | ORAL_TABLET | Freq: Every day | ORAL | 2 refills | Status: AC

## 2022-12-05 NOTE — Patient Instructions (Signed)
Medication Instructions:   START Imdur one (1) tablet by mouth ( 30 mg) daily.   DECREASE Amlodipine one half (0.5) tablet by mouth ( 5 mg) daily.  *If you need a refill on your cardiac medications before your next appointment, please call your pharmacy*   Lab Work:  Your physician recommends that you return for a FASTING lipid profile/cmet in 8 weeks at [facility]. You can come in on the day of your appointment anytime between 8:00-4:30 fasting from midnight the night before. Closed for lunch.      If you have labs (blood work) drawn today and your tests are completely normal, you will receive your results only by: MyChart Message (if you have MyChart) OR A paper copy in the mail If you have any lab test that is abnormal or we need to change your treatment, we will call you to review the results.   Testing/Procedures:  None ordered.   Follow-Up: At [facility], you and your health needs are our priority.  As part of our continuing mission to provide you with exceptional heart care, we have created designated Provider Care Teams.  These Care Teams include your primary Cardiologist (physician) and Advanced Practice Providers (APPs -  Physician Assistants and Nurse Practitioners) who all work together to provide you with the care you need, when you need it.  We recommend signing up for the patient portal called "MyChart".  Sign up information is provided on this After Visit Summary.  MyChart is used to connect with patients for Virtual Visits (Telemedicine).  Patients are able to view lab/test results, encounter notes, upcoming appointments, etc.  Non-urgent messages can be sent to your provider as well.   To learn more about what you can do with MyChart, go to [email or web address].    Your next appointment:   4 month(s)  Provider:   Reatha Harps, MD     Other Instructions  You have been referred to Cardiac Rehab in [facility].

## 2022-12-06 ENCOUNTER — Telehealth: Payer: Self-pay | Admitting: Family

## 2022-12-06 ENCOUNTER — Telehealth: Payer: Self-pay | Admitting: Cardiovascular Disease

## 2022-12-06 ENCOUNTER — Ambulatory Visit: Payer: 59 | Admitting: Obstetrics and Gynecology

## 2022-12-06 NOTE — Telephone Encounter (Signed)
Form has been placed in PCP folder for review.

## 2022-12-06 NOTE — Telephone Encounter (Signed)
Pt states that she needs a doctors note for work for the next two weeks. Please advise

## 2022-12-06 NOTE — Telephone Encounter (Signed)
Pt dropped off FMLA paperwork for her employer. Pt said she was told her PCP has to complete the forms and she's been out since 11/24/22. Please complete and fax paperwork. Pt would like a call back to advise when completed. PW placed in provider's box.

## 2022-12-06 NOTE — Telephone Encounter (Signed)
Patient states she discussed with Eligha Bridegroom, NP being out of work for 2 weeks following STEMI and PCI/DES x1 on 11/25/22.  Patient states she will need a note for work to be out, she does not have a return to work date and wanted to discuss that with Marcelino Duster.  Informed patient that Marcelino Duster is not in the office today, but will be back tomorrow. Will forward message to her to address return to work note.

## 2022-12-08 ENCOUNTER — Encounter: Payer: Self-pay | Admitting: Family

## 2022-12-08 ENCOUNTER — Encounter: Payer: Self-pay | Admitting: Nurse Practitioner

## 2022-12-08 NOTE — Telephone Encounter (Signed)
Can you write this letter?  She is needing asap please

## 2022-12-08 NOTE — Telephone Encounter (Signed)
Pt advised of NP comments. Aware letter is available for her via mychart. Pt appreciates our help.  (Her PCP is getting the other paperwork completed)

## 2022-12-08 NOTE — Telephone Encounter (Signed)
Called and spoke with pt, informed pt PCP would be able to fill out her FMLA paperwork, however PCP will not be able to fill out "return to work authorization" as PCP is not the one to pull her out off work. The cardiologist will the the one to determine pts start back day. Pt is aware and expressed understanding. Advised pt I will give her a call back once form is completed.

## 2022-12-08 NOTE — Telephone Encounter (Signed)
STEMI and PCI/DES x1 on 11/25/22.   Pt is asking to have a date that she will return to work... her next follow up with Dr Flora Lipps until 03/2023... will forward for his review.

## 2022-12-08 NOTE — Telephone Encounter (Signed)
Please see phone note  

## 2022-12-08 NOTE — Telephone Encounter (Signed)
Patient is following up requesting an update on return to work note.

## 2022-12-08 NOTE — Telephone Encounter (Signed)
I will write a letter stating she can return to work after August 9. She brought paperwork to her appointment that required details about her procedure which I advised needed to be completed by [facility] where she had her cath. I am not willing to do that paperwork but I am willing to write a return to work letter.   Thank you, Marcelino Duster

## 2022-12-09 ENCOUNTER — Ambulatory Visit: Payer: 59 | Admitting: Cardiovascular Disease

## 2022-12-10 ENCOUNTER — Encounter: Payer: Self-pay | Admitting: Family

## 2022-12-12 NOTE — Telephone Encounter (Signed)
Called pt was advised to go into [facility] do E-visit ,and they will be  Able to help with testing positive. Pt said she done mychart visit before.

## 2022-12-13 ENCOUNTER — Telehealth: Payer: Self-pay | Admitting: Nurse Practitioner

## 2022-12-13 NOTE — Telephone Encounter (Signed)
Received note will give to MSW and will call pt when done.

## 2022-12-13 NOTE — Telephone Encounter (Signed)
Patient dropped off Return to Work form she is needing filled out and called when done  In providers box

## 2022-12-16 NOTE — Telephone Encounter (Signed)
Tried multiple numbers on pt's DPR to let pt know work note is ready.  Will leave note up front and try pt later.

## 2022-12-19 NOTE — Telephone Encounter (Signed)
No vm will try later.

## 2022-12-19 NOTE — Telephone Encounter (Signed)
Pt's work note has been picked up.

## 2022-12-30 ENCOUNTER — Encounter: Payer: Self-pay | Admitting: Family

## 2022-12-30 ENCOUNTER — Ambulatory Visit (INDEPENDENT_AMBULATORY_CARE_PROVIDER_SITE_OTHER): Payer: 59 | Admitting: Family

## 2022-12-30 ENCOUNTER — Ambulatory Visit: Payer: 59 | Admitting: Gastroenterology

## 2022-12-30 VITALS — BP 116/82 | HR 82 | Resp 18 | Ht 63.0 in | Wt 221.8 lb

## 2022-12-30 DIAGNOSIS — F418 Other specified anxiety disorders: Secondary | ICD-10-CM | POA: Diagnosis not present

## 2022-12-30 MED ORDER — SERTRALINE HCL 50 MG PO TABS
ORAL_TABLET | ORAL | 3 refills | Status: AC
Start: 2022-12-30 — End: ?

## 2022-12-30 NOTE — Progress Notes (Signed)
Amy Wade is a 52 y.o. female with the following history as recorded in EpicCare:  Patient Active Problem List   Diagnosis Date Noted   Fibroids 09/02/2022   Chest pain 08/10/2022   Hypokalemia 08/10/2022   Hypocalcemia 08/10/2022   Obesity (BMI 30-39.9) 08/10/2022   History of MI (myocardial infarction) 05/22/2015   Coronary artery disease involving native coronary artery of native heart with angina pectoris (HCC) 10/13/2012   Essential hypertension 10/13/2012   Tobacco use disorder 10/13/2012   Depressive disorder 10/13/2012   Hyperlipidemia 10/13/2012   Iron deficiency anemia 10/13/2012   Vitamin D deficiency 10/13/2012    Current Outpatient Medications  Medication Sig Dispense Refill   amLODipine (NORVASC) 10 MG tablet Take 0.5 tablets (5 mg total) by mouth daily. 45 tablet 2   aspirin EC 81 MG tablet Take 1 tablet (81 mg total) by mouth daily. Swallow whole. 30 tablet 12   atorvastatin (LIPITOR) 80 MG tablet Take 80 mg by mouth daily.     carvedilol (COREG) 25 MG tablet Take 1 tablet (25 mg total) by mouth 2 (two) times daily with a meal.     isosorbide mononitrate (IMDUR) 30 MG 24 hr tablet Take 1 tablet (30 mg total) by mouth daily. 90 tablet 3   losartan (COZAAR) 100 MG tablet Take 100 mg by mouth daily.     nitroGLYCERIN (NITROSTAT) 0.4 MG SL tablet Place 1 tablet (0.4 mg total) under the tongue every 5 (five) minutes x 3 doses as needed for chest pain. 25 tablet 1   pantoprazole (PROTONIX) 40 MG tablet Take 1 tablet (40 mg total) by mouth daily. 90 tablet 3   sertraline (ZOLOFT) 50 MG tablet Take 1/2 tablet x 7 days; then 1 tablet daily 30 tablet 3   ticagrelor (BRILINTA) 90 MG TABS tablet Take 90 mg by mouth 2 (two) times daily.     No current facility-administered medications for this visit.    Allergies: Ivp dye [iodinated contrast media]  Past Medical History:  Diagnosis Date   Coronary artery disease    Hyperlipidemia    Hypertension    Myocardial  infarction (HCC)    Vaginal Pap smear, abnormal     Past Surgical History:  Procedure Laterality Date   CARDIAC CATHETERIZATION     ECTOPIC PREGNANCY SURGERY     LEFT HEART CATH AND CORONARY ANGIOGRAPHY N/A 08/11/2022   Procedure: LEFT HEART CATH AND CORONARY ANGIOGRAPHY;  Surgeon: Tonny Bollman, MD;  Location: [facility] INVASIVE CV LAB;  Service: Cardiovascular;  Laterality: N/A;    Family History  Problem Relation Age of Onset   Heart disease Father     Social History   Tobacco Use   Smoking status: Former    Current packs/day: 0.50    Average packs/day: 0.5 packs/day for 20.0 years (10.0 ttl pk-yrs)    Types: Cigarettes   Smokeless tobacco: Not on file  Substance Use Topics   Alcohol use: Yes    Subjective:   Concerned for worsening anxiety/ panic type feelings in the past 1 month; seemed to have worsened since going back to work; did have MI in July 2024; no prior history of anxiety; just finding herself easily overwhelmed/ irritated, crying easily;    Objective:  Vitals:   12/30/22 1436  BP: 116/82  Pulse: 82  Resp: 18  SpO2: 98%  Weight: 221 lb 12.8 oz (100.6 kg)  Height: 5\' 3"  (1.6 m)    General: Well developed, well nourished, in no acute distress  Skin : Warm and dry.  Head: Normocephalic and atraumatic  Lungs: Respirations unlabored; clear to auscultation bilaterally without wheeze, rales, rhonchi  CVS exam: normal rate and regular rhythm.  Neurologic: Alert and oriented; speech intact; face symmetrical; moves all extremities well; CNII-XII intact without focal deficit   Assessment:  1. Situational anxiety     Plan:   Reassurance that this is reasonable and very understandable secondary to recent MI; will give trial of Sertraline 50 mg daily- risks/ benefits discussed; patient defers meeting with therapist at this time; will plan to do virtual visit in follow up in 1 month.   No follow-ups on file.  No orders of the defined types were placed in this  encounter.   Requested Prescriptions   Signed Prescriptions Disp Refills   sertraline (ZOLOFT) 50 MG tablet 30 tablet 3    Sig: Take 1/2 tablet x 7 days; then 1 tablet daily

## 2022-12-30 NOTE — Patient Instructions (Signed)
[phone]  Please call GI back to schedule your appointment;

## 2023-01-31 ENCOUNTER — Telehealth: Payer: 59 | Admitting: Family

## 2023-01-31 VITALS — Ht 63.0 in

## 2023-01-31 DIAGNOSIS — F418 Other specified anxiety disorders: Secondary | ICD-10-CM

## 2023-01-31 NOTE — Progress Notes (Signed)
Patient refused to do visit when contacted by CMA to start the process; notes she was "too tired" today and would call back at later time. She did ask CMA to relay message to provider that she was doing "okay" as far as her anxiety.

## 2023-03-21 NOTE — Progress Notes (Deleted)
  Cardiology Office Note:  .   Date:  03/21/2023  ID:  Amy Wade, DOB [birth year], MRN [id number] PCP: Olive Bass, FNP  [facility] Providers Cardiologist:  Reatha Harps, MD { Click to update primary MD,subspecialty MD or APP then REFRESH:1}   History of Present Illness: .   Amy Wade is a 52 y.o. female with history of CAD who presents for follow-up.      Problem List CAD -PCI 2012 ([place]) -LHC 08/11/2022: 50% mRCA; 50% RPDA; 50% OM2, 50% mid LAD -Lp(a) 41.6 2. HLD -T chol 226, HDL 56, LDL 151, TG 83 3. HTN    ROS: All other ROS reviewed and negative. Pertinent positives noted in the HPI.     Studies Reviewed: Marland Kitchen        TTE 08/11/2022  1. Left ventricular ejection fraction, by estimation, is 50%. Left  ventricular ejection fraction by PLAX is 50 %. The left ventricle has  mildly decreased function. The left ventricle demonstrates regional wall  motion abnormalities (see scoring  diagram/findings for description). Left ventricular diastolic parameters  are consistent with Grade I diastolic dysfunction (impaired relaxation).   2. Right ventricular systolic function is normal. The right ventricular  size is normal. There is normal pulmonary artery systolic pressure.   3. The mitral valve is normal in structure. Mild mitral valve  regurgitation. No evidence of mitral stenosis.   4. The aortic valve is tricuspid. Aortic valve regurgitation is not  visualized. No aortic stenosis is present.   5. The inferior vena cava is normal in size with greater than 50%  respiratory variability, suggesting right atrial pressure of 3 mmHg.  Physical Exam:   VS:  There were no vitals taken for this visit.   Wt Readings from Last 3 Encounters:  12/30/22 221 lb 12.8 oz (100.6 kg)  12/05/22 224 lb 12.8 oz (102 kg)  12/02/22 223 lb 6.4 oz (101.3 kg)    GEN: Well nourished, well developed in no acute distress NECK: No JVD; No carotid bruits CARDIAC:  ***RRR, no murmurs, rubs, gallops RESPIRATORY:  Clear to auscultation without rales, wheezing or rhonchi  ABDOMEN: Soft, non-tender, non-distended EXTREMITIES:  No edema; No deformity  ASSESSMENT AND PLAN: .   ***    {Are you ordering a CV Procedure (e.g. stress test, cath, DCCV, TEE, etc)?   Press F2        :[id number]}   Follow-up: No follow-ups on file.  Time Spent with Patient: I have spent a total of *** minutes caring for this patient today face to face, ordering and reviewing labs/tests, reviewing prior records/medical history, examining the patient, establishing an assessment and plan, communicating results/findings to the patient/family, and documenting in the medical record.   Signed, Lenna Gilford. Flora Lipps, MD, [facility]  [facility]  [address] [place], [place] [address] [phone]  9:02 AM

## 2023-03-24 ENCOUNTER — Ambulatory Visit: Payer: 59 | Attending: Cardiovascular Disease | Admitting: Cardiovascular Disease

## 2023-03-24 DIAGNOSIS — E785 Hyperlipidemia, unspecified: Secondary | ICD-10-CM

## 2023-03-24 DIAGNOSIS — I2510 Atherosclerotic heart disease of native coronary artery without angina pectoris: Secondary | ICD-10-CM

## 2023-10-01 DIAGNOSIS — I10 Essential (primary) hypertension: Secondary | ICD-10-CM | POA: Diagnosis not present

## 2023-10-01 DIAGNOSIS — Z79899 Other long term (current) drug therapy: Secondary | ICD-10-CM | POA: Diagnosis not present

## 2023-10-01 DIAGNOSIS — I25119 Atherosclerotic heart disease of native coronary artery with unspecified angina pectoris: Secondary | ICD-10-CM | POA: Diagnosis not present

## 2023-10-01 DIAGNOSIS — I214 Non-ST elevation (NSTEMI) myocardial infarction: Secondary | ICD-10-CM | POA: Diagnosis not present

## 2023-10-01 DIAGNOSIS — T82855A Stenosis of coronary artery stent, initial encounter: Secondary | ICD-10-CM | POA: Diagnosis not present

## 2023-10-01 DIAGNOSIS — Z6835 Body mass index (BMI) 35.0-35.9, adult: Secondary | ICD-10-CM | POA: Diagnosis not present

## 2023-10-01 DIAGNOSIS — I2510 Atherosclerotic heart disease of native coronary artery without angina pectoris: Secondary | ICD-10-CM | POA: Diagnosis not present

## 2023-10-01 DIAGNOSIS — K219 Gastro-esophageal reflux disease without esophagitis: Secondary | ICD-10-CM | POA: Diagnosis not present

## 2023-10-01 DIAGNOSIS — E785 Hyperlipidemia, unspecified: Secondary | ICD-10-CM | POA: Diagnosis not present

## 2023-10-01 DIAGNOSIS — R9431 Abnormal electrocardiogram [ECG] [EKG]: Secondary | ICD-10-CM | POA: Diagnosis not present

## 2023-10-01 DIAGNOSIS — R1111 Vomiting without nausea: Secondary | ICD-10-CM | POA: Diagnosis not present

## 2023-10-01 DIAGNOSIS — F17210 Nicotine dependence, cigarettes, uncomplicated: Secondary | ICD-10-CM | POA: Diagnosis not present

## 2023-10-01 DIAGNOSIS — I160 Hypertensive urgency: Secondary | ICD-10-CM | POA: Diagnosis not present

## 2023-10-01 DIAGNOSIS — Z91041 Radiographic dye allergy status: Secondary | ICD-10-CM | POA: Diagnosis not present

## 2023-10-01 DIAGNOSIS — I444 Left anterior fascicular block: Secondary | ICD-10-CM | POA: Diagnosis not present

## 2023-10-01 DIAGNOSIS — Z7982 Long term (current) use of aspirin: Secondary | ICD-10-CM | POA: Diagnosis not present

## 2023-10-01 DIAGNOSIS — R0789 Other chest pain: Secondary | ICD-10-CM | POA: Diagnosis not present

## 2023-10-01 DIAGNOSIS — Z955 Presence of coronary angioplasty implant and graft: Secondary | ICD-10-CM | POA: Diagnosis not present

## 2023-10-01 DIAGNOSIS — I252 Old myocardial infarction: Secondary | ICD-10-CM | POA: Diagnosis not present

## 2023-10-01 DIAGNOSIS — F1290 Cannabis use, unspecified, uncomplicated: Secondary | ICD-10-CM | POA: Diagnosis not present

## 2023-10-01 DIAGNOSIS — D72829 Elevated white blood cell count, unspecified: Secondary | ICD-10-CM | POA: Diagnosis not present

## 2023-10-01 DIAGNOSIS — I2109 ST elevation (STEMI) myocardial infarction involving other coronary artery of anterior wall: Secondary | ICD-10-CM | POA: Diagnosis not present

## 2023-10-01 DIAGNOSIS — Y831 Surgical operation with implant of artificial internal device as the cause of abnormal reaction of the patient, or of later complication, without mention of misadventure at the time of the procedure: Secondary | ICD-10-CM | POA: Diagnosis not present

## 2023-10-01 DIAGNOSIS — I340 Nonrheumatic mitral (valve) insufficiency: Secondary | ICD-10-CM | POA: Diagnosis not present

## 2023-10-01 DIAGNOSIS — R110 Nausea: Secondary | ICD-10-CM | POA: Diagnosis not present

## 2023-10-01 DIAGNOSIS — I499 Cardiac arrhythmia, unspecified: Secondary | ICD-10-CM | POA: Diagnosis not present

## 2023-10-01 DIAGNOSIS — R079 Chest pain, unspecified: Secondary | ICD-10-CM | POA: Diagnosis not present

## 2023-10-01 DIAGNOSIS — E876 Hypokalemia: Secondary | ICD-10-CM | POA: Diagnosis not present

## 2023-10-01 DIAGNOSIS — E669 Obesity, unspecified: Secondary | ICD-10-CM | POA: Diagnosis not present

## 2023-10-02 DIAGNOSIS — I214 Non-ST elevation (NSTEMI) myocardial infarction: Secondary | ICD-10-CM | POA: Diagnosis not present

## 2023-10-05 ENCOUNTER — Telehealth: Payer: Self-pay

## 2024-01-15 ENCOUNTER — Telehealth: Payer: Self-pay

## 2024-01-15 NOTE — Telephone Encounter (Signed)
 Patient called in stating that she is having postmenopausal bleeding. Consulted with [facility], RN about scheduling and per protocols we need to have her scheduled with a provider within two weeks.   Offered her an appointment on 9/17 with Dr. Eveline and patient denied it as she needed an emergency appointment.

## 2024-01-24 ENCOUNTER — Ambulatory Visit (INDEPENDENT_AMBULATORY_CARE_PROVIDER_SITE_OTHER): Admitting: Obstetrics & Gynecology

## 2024-01-24 ENCOUNTER — Other Ambulatory Visit (HOSPITAL_COMMUNITY)
Admission: RE | Admit: 2024-01-24 | Discharge: 2024-01-24 | Disposition: A | Discharge status: Home / Self Care | Source: Ambulatory Visit | Attending: Obstetrics & Gynecology | Admitting: Obstetrics & Gynecology

## 2024-01-24 VITALS — BP 175/114 | HR 101 | Wt 202.0 lb

## 2024-01-24 DIAGNOSIS — N924 Excessive bleeding in the premenopausal period: Secondary | ICD-10-CM

## 2024-01-24 NOTE — Progress Notes (Unsigned)
 Patient ID: Amy Wade, female   DOB: 11-18-1970, 53 y.o.   MRN: [id number]  No chief complaint on file.   HPI Amy Wade is a 53 y.o. female.  [id number] No LMP recorded. (Menstrual status: Perimenopausal). Patient had some vaginal bleeding and she was unsure if she is peri or postmenopausal  HPI  Past Medical History:  Diagnosis Date   Coronary artery disease    Hyperlipidemia    Hypertension    Myocardial infarction (HCC)    Vaginal Pap smear, abnormal     Past Surgical History:  Procedure Laterality Date   CARDIAC CATHETERIZATION     ECTOPIC PREGNANCY SURGERY     LEFT HEART CATH AND CORONARY ANGIOGRAPHY N/A 08/11/2022   Procedure: LEFT HEART CATH AND CORONARY ANGIOGRAPHY;  Surgeon: Wonda Sharper, MD;  Location: [facility] INVASIVE CV LAB;  Service: Cardiovascular;  Laterality: N/A;    Family History  Problem Relation Age of Onset   Heart disease Father     Social History Social History   Tobacco Use   Smoking status: Former    Current packs/day: 0.50    Average packs/day: 0.5 packs/day for 20.0 years (10.0 ttl pk-yrs)    Types: Cigarettes  Vaping Use   Vaping status: Never Used  Substance Use Topics   Alcohol use: Yes   Drug use: No    Allergies  Allergen Reactions   Ivp Dye [Iodinated Contrast Media] Hives    Current Outpatient Medications  Medication Sig Dispense Refill   amLODipine  (NORVASC ) 10 MG tablet Take 0.5 tablets (5 mg total) by mouth daily. 45 tablet 2   aspirin  EC 81 MG tablet Take 1 tablet (81 mg total) by mouth daily. Swallow whole. 30 tablet 12   atorvastatin (LIPITOR) 80 MG tablet Take 80 mg by mouth daily.     carvedilol  (COREG ) 25 MG tablet Take 1 tablet (25 mg total) by mouth 2 (two) times daily with a meal.     isosorbide  mononitrate (IMDUR ) 30 MG 24 hr tablet Take 1 tablet (30 mg total) by mouth daily. 90 tablet 3   losartan  (COZAAR ) 100 MG tablet Take 100 mg by mouth daily.     nitroGLYCERIN  (NITROSTAT ) 0.4 MG SL tablet Place 1  tablet (0.4 mg total) under the tongue every 5 (five) minutes x 3 doses as needed for chest pain. 25 tablet 1   pantoprazole  (PROTONIX ) 40 MG tablet Take 1 tablet (40 mg total) by mouth daily. 90 tablet 3   sertraline  (ZOLOFT ) 50 MG tablet Take 1/2 tablet x 7 days; then 1 tablet daily 30 tablet 3   ticagrelor (BRILINTA) 90 MG TABS tablet Take 90 mg by mouth 2 (two) times daily. (Patient not taking: Reported on 01/24/2024)     No current facility-administered medications for this visit.    Review of Systems Review of Systems  Constitutional: Negative.   Respiratory: Negative.    Cardiovascular: Negative.   Genitourinary:  Positive for menstrual problem. Negative for pelvic pain, vaginal bleeding and vaginal discharge.    Blood pressure (!) 175/114, pulse (!) 101, weight 202 lb (91.6 kg).  Physical Exam Physical Exam Vitals and nursing note reviewed.  Constitutional:      Appearance: Normal appearance.  Cardiovascular:     Rate and Rhythm: Normal rate.  Pulmonary:     Effort: Pulmonary effort is normal.  Abdominal:     General: Abdomen is flat.  Neurological:     Mental Status: She is alert.  Psychiatric:  Mood and Affect: Mood normal.        Behavior: Behavior normal.     Data Reviewed FSH 8 last year  Assessment Perimenopausal vaginal bleeding  Plan Orders Placed This Encounter  Procedures   US  PELVIC COMPLETE WITH TRANSVAGINAL    Standing Status:   Future    Expected Date:   01/31/2024    Expiration Date:   01/23/2025    Reason for Exam (SYMPTOM  OR DIAGNOSIS REQUIRED):   perimenopausal DUb and fibroid    Preferred imaging location?:   [facility]   [facility]   Estrogens , Total   RTC 6 weeks after testing    [name] 01/24/2024, 2:36 PM

## 2024-01-26 LAB — CERVICOVAGINAL ANCILLARY ONLY
Bacterial Vaginitis (gardnerella): POSITIVE — AB
Candida Glabrata: NEGATIVE
Candida Vaginitis: NEGATIVE
Chlamydia: NEGATIVE
Comment: NEGATIVE
Comment: NEGATIVE
Comment: NEGATIVE
Comment: NEGATIVE
Comment: NEGATIVE
Comment: NORMAL
Neisseria Gonorrhea: NEGATIVE
Trichomonas: NEGATIVE

## 2024-01-26 LAB — ESTROGENS, TOTAL: Estrogen: 58 pg/mL

## 2024-01-26 LAB — FOLLICLE STIMULATING HORMONE: FSH: 79 m[IU]/mL

## 2024-01-29 ENCOUNTER — Other Ambulatory Visit: Payer: Self-pay

## 2024-01-29 DIAGNOSIS — N760 Acute vaginitis: Secondary | ICD-10-CM

## 2024-01-29 MED ORDER — METRONIDAZOLE 500 MG PO TABS: 500.0000 mg | ORAL_TABLET | Freq: Two times a day (BID) | ORAL | 0 refills | Status: AC

## 2024-02-04 ENCOUNTER — Encounter (HOSPITAL_BASED_OUTPATIENT_CLINIC_OR_DEPARTMENT_OTHER): Payer: Self-pay

## 2024-02-04 ENCOUNTER — Ambulatory Visit (HOSPITAL_BASED_OUTPATIENT_CLINIC_OR_DEPARTMENT_OTHER)

## 2024-02-07 ENCOUNTER — Telehealth (HOSPITAL_BASED_OUTPATIENT_CLINIC_OR_DEPARTMENT_OTHER): Payer: Self-pay

## 2024-02-09 ENCOUNTER — Emergency Department (HOSPITAL_BASED_OUTPATIENT_CLINIC_OR_DEPARTMENT_OTHER)
Admission: EM | Admit: 2024-02-09 | Discharge: 2024-02-09 | Disposition: A | Discharge status: Home / Self Care | Attending: Emergency Medicine | Admitting: Emergency Medicine

## 2024-02-09 ENCOUNTER — Other Ambulatory Visit: Payer: Self-pay

## 2024-02-09 ENCOUNTER — Encounter (HOSPITAL_BASED_OUTPATIENT_CLINIC_OR_DEPARTMENT_OTHER): Payer: Self-pay | Admitting: Emergency Medicine

## 2024-02-09 ENCOUNTER — Emergency Department (HOSPITAL_BASED_OUTPATIENT_CLINIC_OR_DEPARTMENT_OTHER)

## 2024-02-09 DIAGNOSIS — J069 Acute upper respiratory infection, unspecified: Secondary | ICD-10-CM | POA: Insufficient documentation

## 2024-02-09 DIAGNOSIS — R059 Cough, unspecified: Secondary | ICD-10-CM | POA: Diagnosis not present

## 2024-02-09 DIAGNOSIS — J4540 Moderate persistent asthma, uncomplicated: Secondary | ICD-10-CM | POA: Diagnosis not present

## 2024-02-09 DIAGNOSIS — J45909 Unspecified asthma, uncomplicated: Secondary | ICD-10-CM | POA: Diagnosis not present

## 2024-02-09 DIAGNOSIS — B9789 Other viral agents as the cause of diseases classified elsewhere: Secondary | ICD-10-CM | POA: Diagnosis not present

## 2024-02-09 DIAGNOSIS — Z7982 Long term (current) use of aspirin: Secondary | ICD-10-CM | POA: Insufficient documentation

## 2024-02-09 LAB — BASIC METABOLIC PANEL WITH GFR
Anion gap: 12 (ref 5–15)
BUN: 11 mg/dL (ref 6–20)
CO2: 25 mmol/L (ref 22–32)
Calcium: 9.4 mg/dL (ref 8.9–10.3)
Chloride: 105 mmol/L (ref 98–111)
Creatinine, Ser: 0.9 mg/dL (ref 0.44–1.00)
GFR, Estimated: >60 mL/min (ref >60)
Glucose, Bld: 111 mg/dL — ABNORMAL HIGH (ref 70–99)
Potassium: 3.8 mmol/L (ref 3.5–5.1)
Sodium: 141 mmol/L (ref 135–145)

## 2024-02-09 LAB — CBC
HCT: 43.2 % (ref 36.0–46.0)
Hemoglobin: 14.3 g/dL (ref 12.0–15.0)
MCH: 26.7 pg (ref 26.0–34.0)
MCHC: 33.1 g/dL (ref 30.0–36.0)
MCV: 80.6 fL (ref 80.0–100.0)
Platelets: 351 K/uL (ref 150–400)
RBC: 5.36 MIL/uL — ABNORMAL HIGH (ref 3.87–5.11)
RDW: 14.8 % (ref 11.5–15.5)
WBC: 8 K/uL (ref 4.0–10.5)
nRBC: 0 % (ref 0.0–0.2)

## 2024-02-09 LAB — RESP PANEL BY RT-PCR (RSV, FLU A&B, COVID)  RVPGX2
Influenza A by PCR: NEGATIVE
Influenza B by PCR: NEGATIVE
Resp Syncytial Virus by PCR: NEGATIVE
SARS Coronavirus 2 by RT PCR: NEGATIVE

## 2024-02-09 MED ORDER — ALBUTEROL SULFATE (2.5 MG/3ML) 0.083% IN NEBU
2.5000 mg | INHALATION_SOLUTION | Freq: Once | RESPIRATORY_TRACT | Status: AC
  Administered 2024-02-09: 2.5 mg via RESPIRATORY_TRACT
  Filled 2024-02-09: qty 3

## 2024-02-09 MED ORDER — ALBUTEROL SULFATE HFA 108 (90 BASE) MCG/ACT IN AERS
2.0000 | INHALATION_SPRAY | RESPIRATORY_TRACT | Status: DC | PRN
  Administered 2024-02-09: 2 via RESPIRATORY_TRACT
  Filled 2024-02-09: qty 6.7

## 2024-02-09 MED ORDER — PREDNISONE 50 MG PO TABS
60.0000 mg | ORAL_TABLET | Freq: Once | ORAL | Status: AC
  Administered 2024-02-09: 60 mg via ORAL
  Filled 2024-02-09: qty 1

## 2024-02-09 MED ORDER — PREDNISONE 10 MG PO TABS: 40.0000 mg | ORAL_TABLET | Freq: Every day | ORAL | 0 refills | Status: AC

## 2024-02-09 NOTE — ED Notes (Signed)
 Reviewed discharge instructions, follow up and medications. Pt states understanding. Resp even and unlabored. Ambulatory at discharge

## 2024-02-09 NOTE — ED Notes (Signed)
 O2 noted to be as low as 92% on Room air, good waveform noted.

## 2024-02-09 NOTE — Discharge Instructions (Addendum)
 Use the albuterol  inhaler 2 puffs every 6 hours.  Take the prednisone  as directed for the next 5 days.  First dose of prednisone  given to you here.  Return for any new or worse symptoms.  Would expect improvement over the next few days.  Seems to be reactive airway disease which is asthma-like secondary to an upper respiratory infection that is irritating your lungs but your chest x-ray was negative.  No signs of pneumonia

## 2024-02-09 NOTE — ED Triage Notes (Signed)
 Pt reports cough, shob x 3 days. Denies fever, known sick contact.    RT to triage to assess.

## 2024-02-09 NOTE — ED Provider Notes (Addendum)
 [place] [facility] AT [facility] Provider Note   CSN: [id number] Arrival date & time: 02/09/24  1437     Patient presents with: Cough   Amy Wade is a 53 y.o. female.   Patient feeling some shortness of breath and feeling like she has been having some wheezing for the past week.  But is gotten worse in the last 3 days.  Has had an upper respiratory infection for about a week.  Patient upon arrival sats were 92% on room air and was definitely wheezing throughout she was given 2 puffs of an albuterol  inhaler.  Patient states that helped a lot.  But she is still doing some wheezing.  Oxygen saturation on room air currently 95%.  Temp 98.4 respirations 26 blood pressure 188/126 patient states she did not take her blood pressure medicine today she is supposed to be on Norvasc  Coreg .  No fevers.  No nausea vomiting or diarrhea.  No known sick contacts.  Patient does not have a history of asthma.       Prior to Admission medications   Medication Sig Start Date End Date Taking? Authorizing Provider  amLODipine  (NORVASC ) 10 MG tablet Take 0.5 tablets (5 mg total) by mouth daily. 12/05/22   Swinyer, Rosaline HERO, NP  aspirin  EC 81 MG tablet Take 1 tablet (81 mg total) by mouth daily. Swallow whole. 08/12/22   Akula, Vijaya, MD  atorvastatin (LIPITOR) 80 MG tablet Take 80 mg by mouth daily.    [provider]  carvedilol  (COREG ) 25 MG tablet Take 1 tablet (25 mg total) by mouth 2 (two) times daily with a meal. 12/02/22   Jason Leita Repine, FNP  isosorbide  mononitrate (IMDUR ) 30 MG 24 hr tablet Take 1 tablet (30 mg total) by mouth daily. 12/05/22 01/24/24  Swinyer, Rosaline HERO, NP  losartan  (COZAAR ) 100 MG tablet Take 100 mg by mouth daily.    [provider]  metroNIDAZOLE  (FLAGYL ) 500 MG tablet Take 1 tablet (500 mg total) by mouth 2 (two) times daily. 01/29/24   Eveline Lynwood MATSU, MD  nitroGLYCERIN  (NITROSTAT ) 0.4 MG SL tablet Place 1 tablet (0.4 mg  total) under the tongue every 5 (five) minutes x 3 doses as needed for chest pain. 11/30/22 01/24/24  Jason Leita Repine, FNP  pantoprazole  (PROTONIX ) 40 MG tablet Take 1 tablet (40 mg total) by mouth daily. 12/02/22 01/24/24  Jason Leita Repine, FNP  sertraline  (ZOLOFT ) 50 MG tablet Take 1/2 tablet x 7 days; then 1 tablet daily 12/30/22   Jason Leita Repine, FNP  ticagrelor (BRILINTA) 90 MG TABS tablet Take 90 mg by mouth 2 (two) times daily. Patient not taking: Reported on 01/24/2024    [provider]    Allergies: Ivp dye [iodinated contrast media]    Review of Systems  Constitutional:  Negative for chills and fever.  HENT:  Positive for congestion. Negative for ear pain and sore throat.   Eyes:  Negative for pain and visual disturbance.  Respiratory:  Positive for cough, shortness of breath and wheezing.   Cardiovascular:  Negative for chest pain and palpitations.  Gastrointestinal:  Negative for abdominal pain, diarrhea, nausea and vomiting.  Genitourinary:  Negative for dysuria and hematuria.  Musculoskeletal:  Negative for arthralgias and back pain.  Skin:  Negative for color change and rash.  Neurological:  Negative for seizures and syncope.  All other systems reviewed and are negative.   Updated Vital Signs BP (!) 178/115   Pulse 90  Temp 98.4 F (36.9 C)   Resp 17   Ht 1.6 m (5' 3)   Wt 93 kg   SpO2 97%   BMI 36.31 kg/m   Physical Exam Vitals and nursing note reviewed.  Constitutional:      General: She is not in acute distress.    Appearance: Normal appearance. She is well-developed.  HENT:     Head: Normocephalic and atraumatic.  Eyes:     Extraocular Movements: Extraocular movements intact.     Conjunctiva/sclera: Conjunctivae normal.     Pupils: Pupils are equal, round, and reactive to light.  Cardiovascular:     Rate and Rhythm: Normal rate and regular rhythm.     Heart sounds: No murmur heard. Pulmonary:     Effort: Pulmonary effort  is normal. No respiratory distress.     Breath sounds: Wheezing present.  Abdominal:     Palpations: Abdomen is soft.     Tenderness: There is no abdominal tenderness.  Musculoskeletal:        General: No swelling.     Cervical back: Normal range of motion and neck supple.  Skin:    General: Skin is warm and dry.     Capillary Refill: Capillary refill takes less than 2 seconds.  Neurological:     General: No focal deficit present.     Mental Status: She is alert and oriented to person, place, and time.  Psychiatric:        Mood and Affect: Mood normal.     (all labs ordered are listed, but only abnormal results are displayed) Labs Reviewed  CBC - Abnormal; Notable for the following components:      Result Value   RBC 5.36 (*)    All other components within normal limits  RESP PANEL BY RT-PCR (RSV, FLU A&B, COVID)  [id number]  BASIC METABOLIC PANEL WITH GFR  PREGNANCY, URINE    EKG: EKG Interpretation Date/Time:  Friday February 09 2024 15:41:49 EDT Ventricular Rate:  77 PR Interval:  152 QRS Duration:  102 QT Interval:  397 QTC Calculation: 450 R Axis:   -59  Text Interpretation: Sinus rhythm Biatrial enlargement Inferior infarct, old Anterior infarct, old Lateral leads are also involved Confirmed by Lisa Milian [phone]) on 02/09/2024 3:49:00 PM  Radiology: No results found.   Procedures   Medications Ordered in the ED  albuterol  (VENTOLIN  HFA) 108 (90 Base) MCG/ACT inhaler 2 puff (2 puffs Inhalation Given 02/09/24 1531)  albuterol  (PROVENTIL ) (2.5 MG/3ML) 0.083% nebulizer solution 2.5 mg (has no administration in time range)                                    Medical Decision Making Amount and/or Complexity of Data Reviewed Labs: ordered. Radiology: ordered.  Risk Prescription drug management.   Patient nontoxic no acute distress.  But does have some bilateral wheezing.  Will go and give a nebulizer treatment while we are waiting for chest x-ray results.   Symptoms seem to be consistent with upper respiratory infection triggering a reactive airway disease.  Respiratory panel negative.  No influenza COVID RSV.  Basic metabolic panel renal function normal GFR greater than 60 electrolytes normal.  CBC is white blood cell count of 8 and hemoglobin of 14.3 platelets 351.  Chest x-ray negative for any acute cardiopulmonary disease.  Will reassess patient after the nebulizer treatment.  Patient feels much better after the nebulizer.  But probably would benefit from a short course of steroids as well.  Final diagnoses:  Upper respiratory tract infection, unspecified type  Moderate persistent reactive airway disease without complication    ED Discharge Orders     None          Geraldene Hamilton, MD 02/09/24 1619    Geraldene Hamilton, MD 02/09/24 1840

## 2024-02-09 NOTE — ED Notes (Signed)
 EDP at bedside and aware of pt vital signs

## 2024-03-06 ENCOUNTER — Ambulatory Visit: Admitting: Family Medicine
